# Patient Record
Sex: Female | Born: 1980 | Race: White | Hispanic: No | Marital: Single | Smoking: Never smoker
Health system: Southern US, Community
[De-identification: ages and names within clinical notes are randomized; demographics above are authoritative.]

## PROBLEM LIST (undated history)

## (undated) DIAGNOSIS — I10 Essential (primary) hypertension: Secondary | ICD-10-CM

## (undated) DIAGNOSIS — F319 Bipolar disorder, unspecified: Secondary | ICD-10-CM

## (undated) DIAGNOSIS — N83209 Unspecified ovarian cyst, unspecified side: Secondary | ICD-10-CM

## (undated) HISTORY — PX: OTHER SURGICAL HISTORY: SHX169

## (undated) HISTORY — PX: TUBAL LIGATION: SHX77

---

## 2008-03-08 ENCOUNTER — Emergency Department (HOSPITAL_COMMUNITY): Admission: EM | Admit: 2008-03-08 | Discharge: 2008-03-08 | Payer: Self-pay | Admitting: Emergency Medicine

## 2009-06-11 ENCOUNTER — Emergency Department (HOSPITAL_COMMUNITY): Admission: EM | Admit: 2009-06-11 | Discharge: 2009-06-12 | Payer: Self-pay | Admitting: Emergency Medicine

## 2009-10-24 ENCOUNTER — Emergency Department (HOSPITAL_COMMUNITY): Admission: EM | Admit: 2009-10-24 | Discharge: 2009-10-24 | Payer: Self-pay | Admitting: Emergency Medicine

## 2010-03-17 ENCOUNTER — Emergency Department (HOSPITAL_COMMUNITY)
Admission: EM | Admit: 2010-03-17 | Discharge: 2010-03-17 | Payer: Self-pay | Source: Home / Self Care | Admitting: Emergency Medicine

## 2010-05-30 LAB — CBC
HCT: 36.4 % (ref 36.0–46.0)
Hemoglobin: 12.5 g/dL (ref 12.0–15.0)
MCH: 28 pg (ref 26.0–34.0)
MCHC: 34.3 g/dL (ref 30.0–36.0)
MCV: 81.6 fL (ref 78.0–100.0)
Platelets: 308 K/uL (ref 150–400)
RBC: 4.46 MIL/uL (ref 3.87–5.11)
RDW: 13.2 % (ref 11.5–15.5)
WBC: 10.4 K/uL (ref 4.0–10.5)

## 2010-05-30 LAB — POCT PREGNANCY, URINE: Preg Test, Ur: NEGATIVE

## 2010-05-30 LAB — DIFFERENTIAL
Basophils Relative: 0 % (ref 0–1)
Eosinophils Absolute: 0.1 10*3/uL (ref 0.0–0.7)
Monocytes Absolute: 0.8 10*3/uL (ref 0.1–1.0)
Neutrophils Relative %: 75 % (ref 43–77)

## 2010-08-12 ENCOUNTER — Encounter (HOSPITAL_COMMUNITY): Payer: Medicaid Other

## 2010-08-12 ENCOUNTER — Other Ambulatory Visit: Payer: Self-pay | Admitting: Obstetrics & Gynecology

## 2010-08-12 LAB — CBC
HCT: 37.3 % (ref 36.0–46.0)
Platelets: 344 10*3/uL (ref 150–400)
RDW: 13.8 % (ref 11.5–15.5)
WBC: 8 10*3/uL (ref 4.0–10.5)

## 2010-08-12 LAB — COMPREHENSIVE METABOLIC PANEL
Albumin: 3.9 g/dL (ref 3.5–5.2)
Alkaline Phosphatase: 38 U/L — ABNORMAL LOW (ref 39–117)
BUN: 11 mg/dL (ref 6–23)
CO2: 25 mEq/L (ref 19–32)
Calcium: 10.5 mg/dL (ref 8.4–10.5)
Chloride: 107 mEq/L (ref 96–112)
GFR calc non Af Amer: >60 mL/min (ref >60)
Glucose, Bld: 80 mg/dL (ref 70–99)
Total Bilirubin: 0.3 mg/dL (ref 0.3–1.2)
Total Protein: 7.3 g/dL (ref 6.0–8.3)

## 2010-08-12 LAB — URINALYSIS, ROUTINE W REFLEX MICROSCOPIC
Bilirubin Urine: NEGATIVE
Glucose, UA: NEGATIVE mg/dL
Nitrite: NEGATIVE
Specific Gravity, Urine: >1.03 — ABNORMAL HIGH (ref 1.005–1.030)

## 2010-08-12 LAB — SURGICAL PCR SCREEN: Staphylococcus aureus: NEGATIVE

## 2010-08-12 LAB — URINE MICROSCOPIC-ADD ON

## 2010-08-13 LAB — HCG, QUANTITATIVE, PREGNANCY: hCG, Beta Chain, Quant, S: 1 m[IU]/mL (ref <5)

## 2010-08-17 ENCOUNTER — Ambulatory Visit (HOSPITAL_COMMUNITY)
Admission: RE | Admit: 2010-08-17 | Discharge: 2010-08-17 | Disposition: A | Discharge status: Home / Self Care | Payer: Medicaid Other | Source: Ambulatory Visit | Attending: Obstetrics & Gynecology | Admitting: Obstetrics & Gynecology

## 2010-08-17 ENCOUNTER — Other Ambulatory Visit: Payer: Self-pay | Admitting: Obstetrics & Gynecology

## 2010-08-17 DIAGNOSIS — N946 Dysmenorrhea, unspecified: Secondary | ICD-10-CM | POA: Insufficient documentation

## 2010-08-17 DIAGNOSIS — N920 Excessive and frequent menstruation with regular cycle: Secondary | ICD-10-CM | POA: Insufficient documentation

## 2010-08-31 NOTE — Op Note (Signed)
  NAMEJOCABED, CHEESE NO.:  [id number]  MEDICAL RECORD NO.:  [id number]           PATIENT TYPE:  O  LOCATION:  DAYP                          FACILITY:  [facility]  PHYSICIAN:  Lazaro Arms, M.D.   DATE OF BIRTH:  11/07/1980  DATE OF PROCEDURE:  08/17/2010 DATE OF DISCHARGE:                              OPERATIVE REPORT   PREOPERATIVE DIAGNOSES: 1. Menometrorrhagia. 2. Dysmenorrhea.  POSTOPERATIVE DIAGNOSES: 1. Menometrorrhagia. 2. Dysmenorrhea.  PROCEDURE:  Hysteroscopy, dilation and curettage, and endometrial ablation.  SURGEON:  Lazaro Arms, MD  ANESTHESIA:  General endotracheal.  FINDINGS:  The patient had normal endometrium.  No polyps, no fibroids, no abnormalities.  OPERATION:  The patient was taken to the operating room, placed supine position where she underwent general endotracheal anesthesia, placed in low lithotomy position, prepped and draped in the usual sterile fashion. Graves speculum was placed.  Cervix was grasped.  The cervix was dilated serially to allow passage of the hysteroscope.  A diagnostic hysteroscopy was performed, and there were no abnormalities of the endometrium identified.  A vigorous uterine curettage was then performed, good uterine cry was obtained in all areas, endometrial curettings were sent to Pathology for evaluation.  ThermaChoice III endometrial ablation balloon was then used 26 mL of D5W was required to maintain a pressure between 190 and 200 mmHg throughout the procedure. It was heated to 87 degrees Celsius total therapy time was 10 minutes and 31 seconds.  All the fluid was returned at the end of procedure, and all the equipment worked properly.  The patient was awakened from anesthesia, taken to recovery room in good stable condition.  All counts were correct.  She received Toradol and Ancef preoperatively prophylactically.    Lazaro Arms, M.D.    Loraine Maple  D:  08/17/2010  T:  08/17/2010  Job:   [id number]  Electronically Signed by Duane Lope M.D. on 08/31/2010 02:51:10 PM

## 2010-09-21 ENCOUNTER — Emergency Department (HOSPITAL_COMMUNITY)
Admission: EM | Admit: 2010-09-21 | Discharge: 2010-09-21 | Disposition: A | Discharge status: Home / Self Care | Payer: Medicaid Other | Attending: Emergency Medicine | Admitting: Emergency Medicine

## 2010-09-21 DIAGNOSIS — R070 Pain in throat: Secondary | ICD-10-CM | POA: Insufficient documentation

## 2010-09-21 DIAGNOSIS — F319 Bipolar disorder, unspecified: Secondary | ICD-10-CM | POA: Insufficient documentation

## 2010-09-21 DIAGNOSIS — R51 Headache: Secondary | ICD-10-CM | POA: Insufficient documentation

## 2010-09-21 DIAGNOSIS — IMO0001 Reserved for inherently not codable concepts without codable children: Secondary | ICD-10-CM | POA: Insufficient documentation

## 2010-09-21 DIAGNOSIS — R509 Fever, unspecified: Secondary | ICD-10-CM | POA: Insufficient documentation

## 2010-09-21 DIAGNOSIS — J020 Streptococcal pharyngitis: Secondary | ICD-10-CM | POA: Insufficient documentation

## 2010-09-21 DIAGNOSIS — Z79899 Other long term (current) drug therapy: Secondary | ICD-10-CM | POA: Insufficient documentation

## 2010-11-30 IMAGING — CR DG HAND COMPLETE 3+V*R*
3 series · 3 of 3 positions shown · non-contrast
Comparison: None.

CLINICAL DATA: Hand pain.  Blunt trauma.

RIGHT HAND - COMPLETE 3+ VIEW

[view not recorded (1 of 3)]
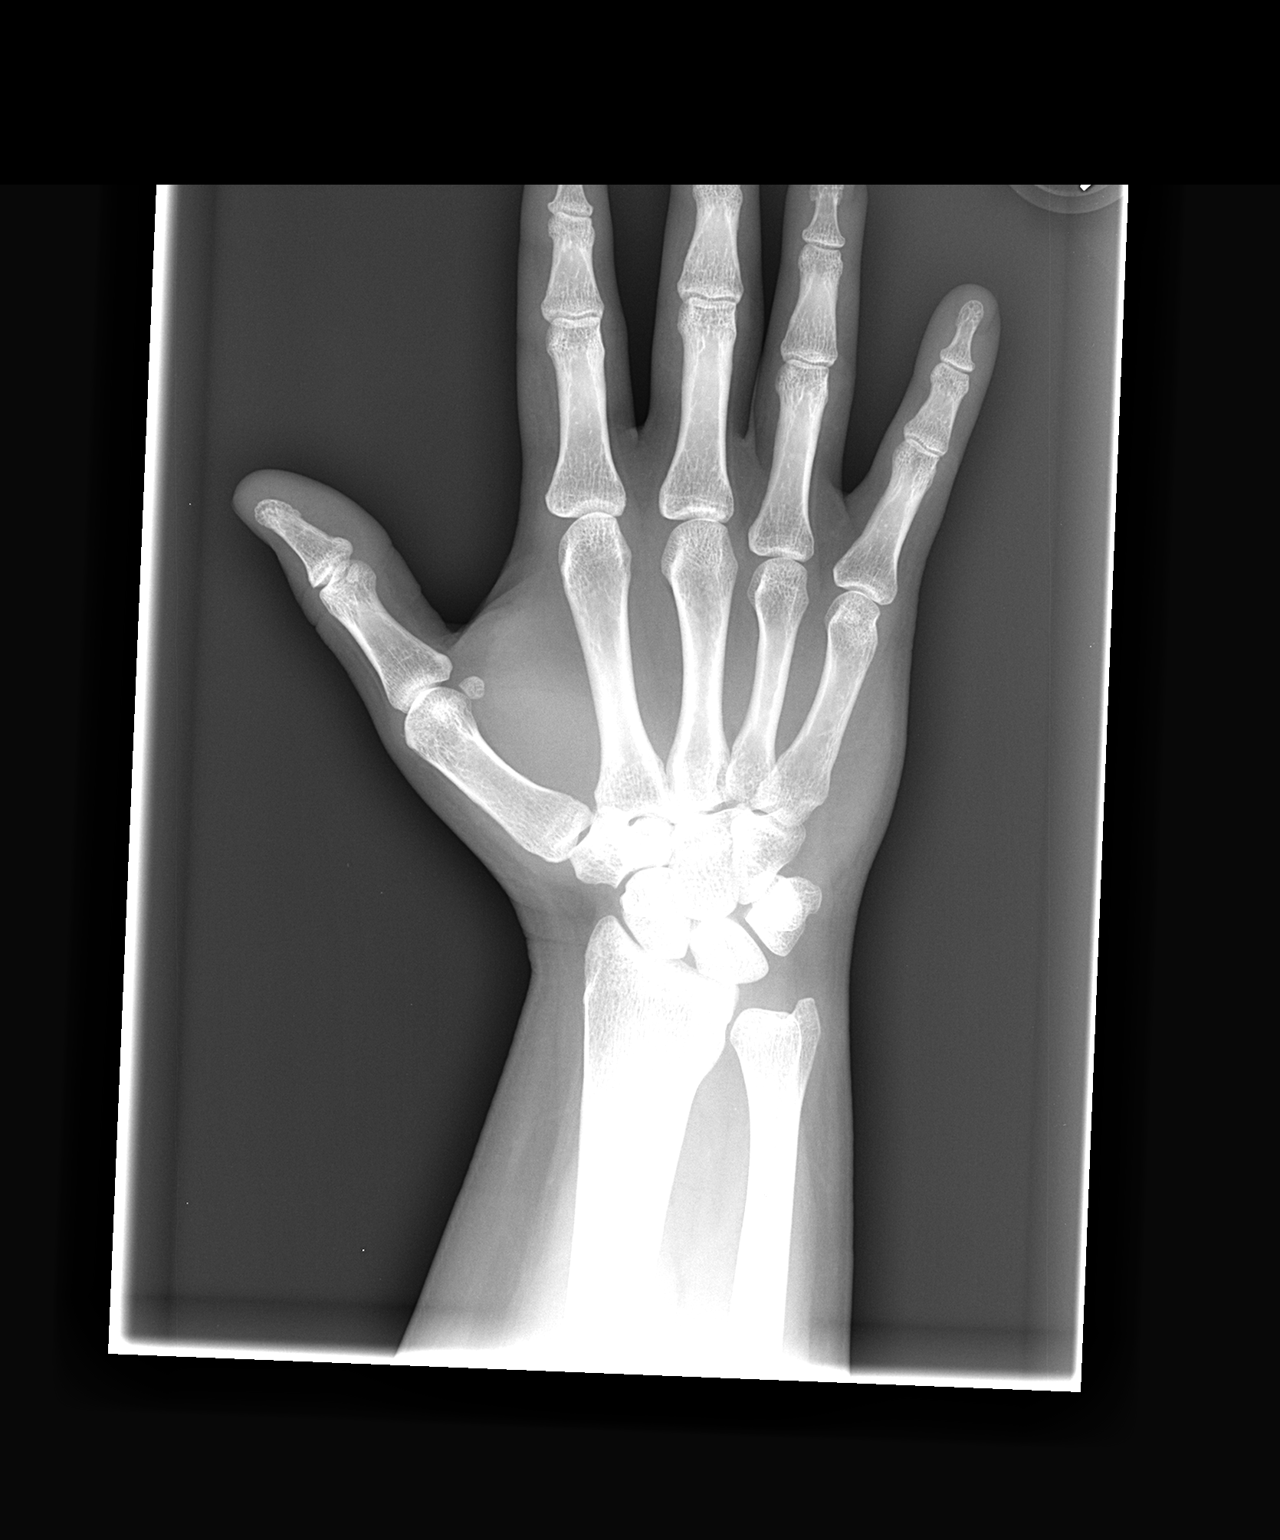

[view not recorded (2 of 3)]
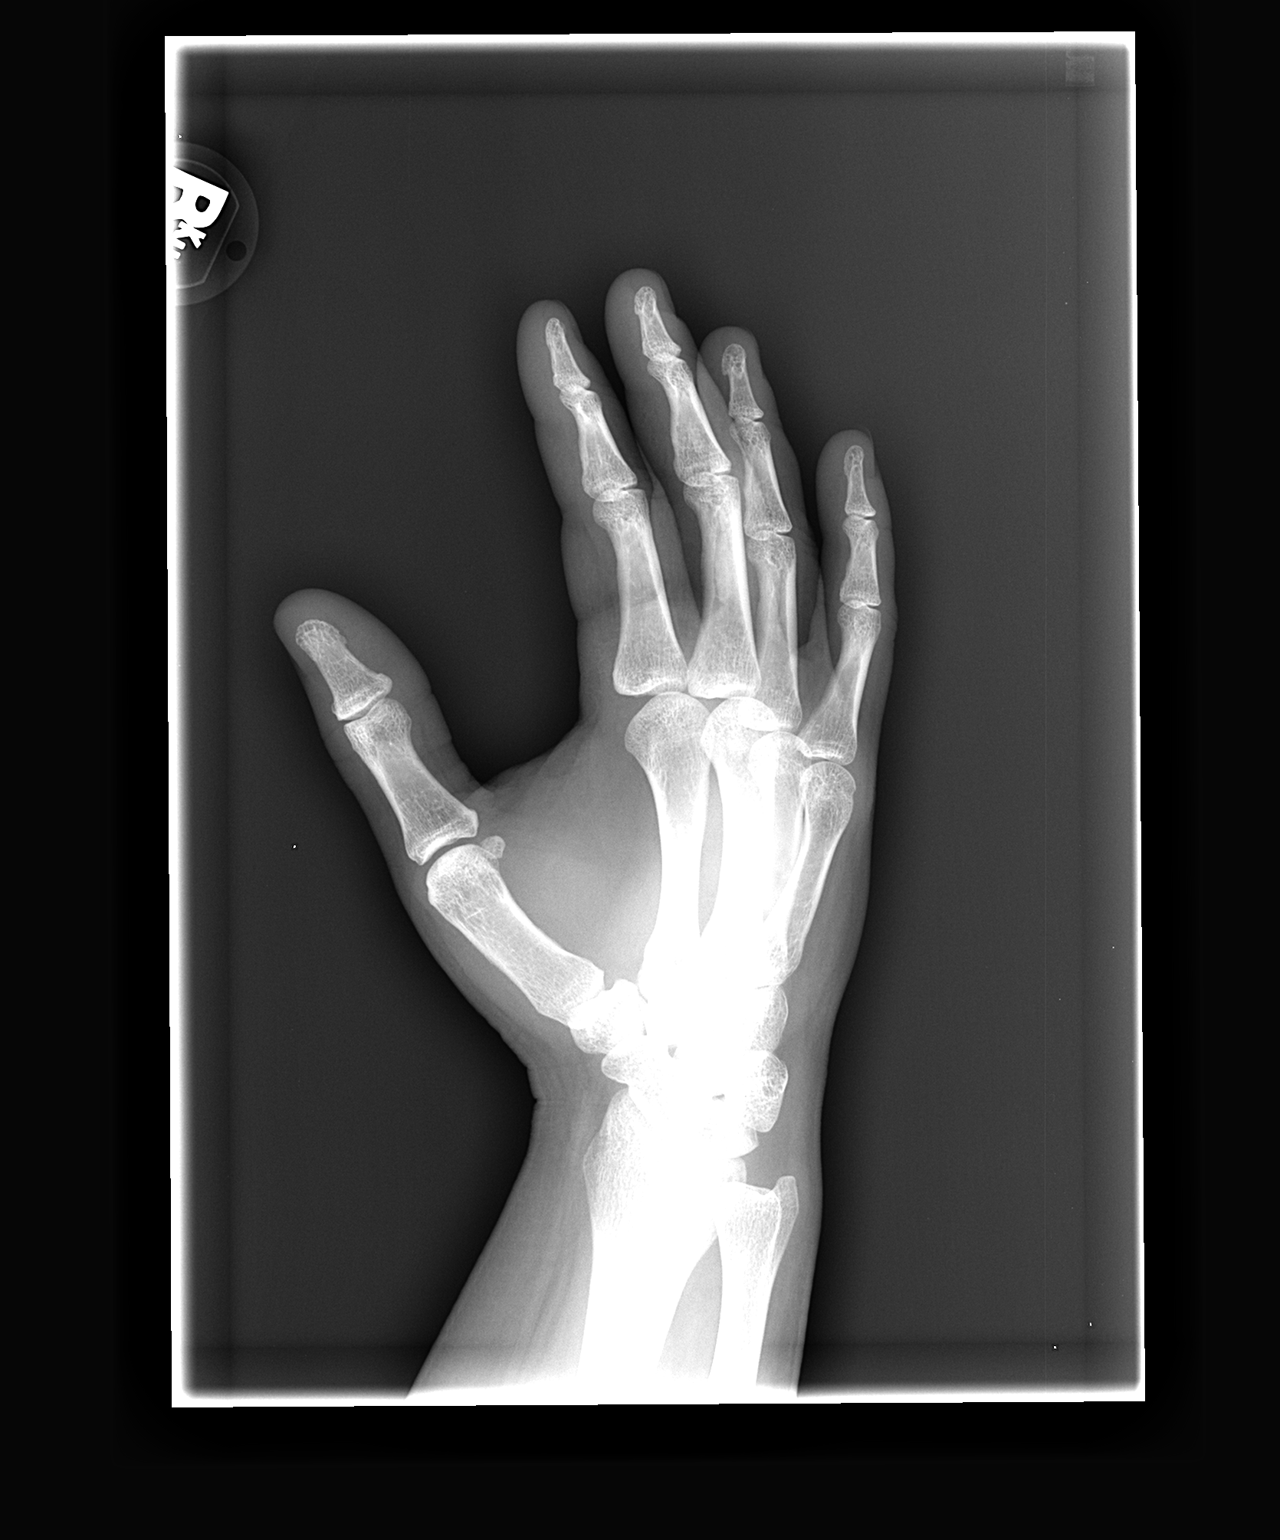

[view not recorded (3 of 3)]
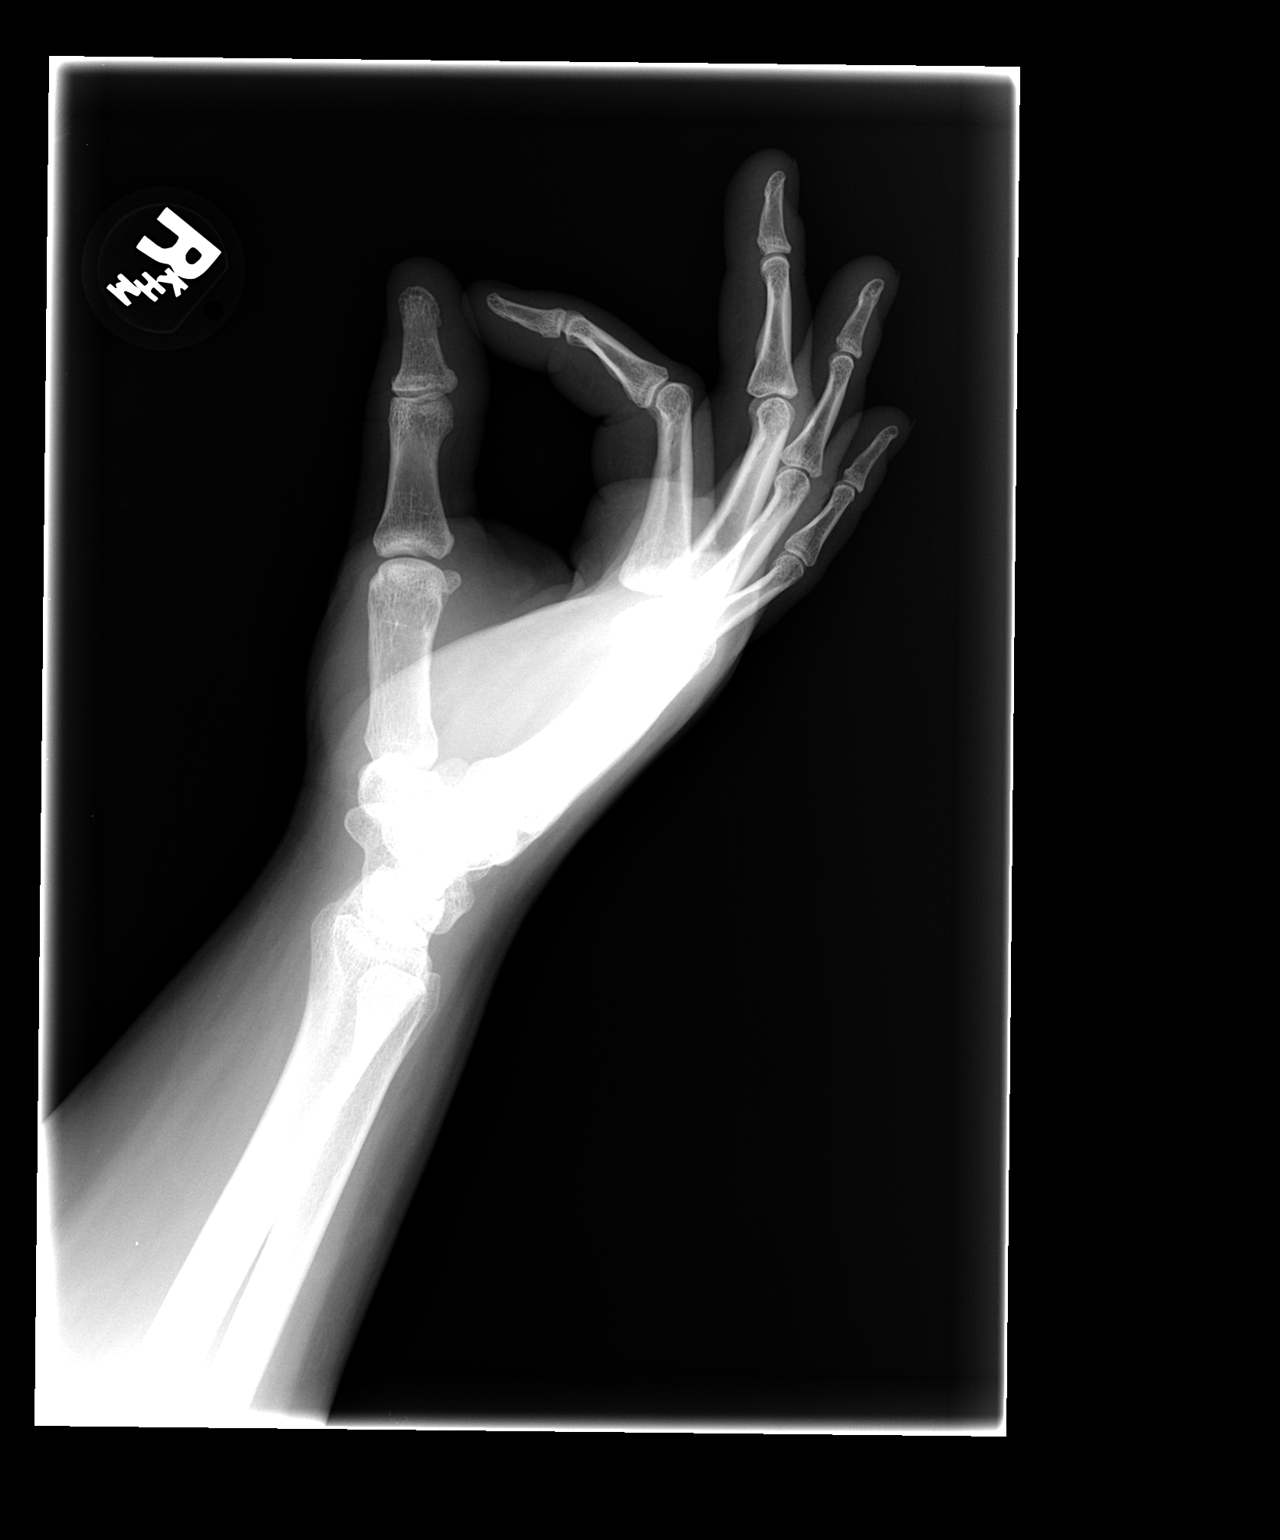

[3 of 3 positions shown; findings below may reference images not displayed]

FINDINGS: Alignment anatomic.  No fracture.  Soft tissues appear
normal.  The fifth metacarpal appears unremarkable.
IMPRESSION: Negative.

## 2010-12-23 LAB — URINALYSIS, ROUTINE W REFLEX MICROSCOPIC
Ketones, ur: NEGATIVE mg/dL
Nitrite: NEGATIVE
Urobilinogen, UA: 0.2 mg/dL (ref 0.0–1.0)

## 2010-12-23 LAB — PREGNANCY, URINE: Preg Test, Ur: NEGATIVE

## 2010-12-23 LAB — RAPID STREP SCREEN (MED CTR MEBANE ONLY): Streptococcus, Group A Screen (Direct): POSITIVE — AB

## 2011-05-12 ENCOUNTER — Encounter (HOSPITAL_COMMUNITY): Payer: Self-pay | Admitting: *Deleted

## 2011-05-12 ENCOUNTER — Emergency Department (HOSPITAL_COMMUNITY)
Admission: EM | Admit: 2011-05-12 | Discharge: 2011-05-12 | Disposition: A | Discharge status: Home / Self Care | Payer: Medicaid Other | Attending: Emergency Medicine | Admitting: Emergency Medicine

## 2011-05-12 ENCOUNTER — Emergency Department (HOSPITAL_COMMUNITY): Payer: Medicaid Other

## 2011-05-12 DIAGNOSIS — R112 Nausea with vomiting, unspecified: Secondary | ICD-10-CM | POA: Insufficient documentation

## 2011-05-12 DIAGNOSIS — R197 Diarrhea, unspecified: Secondary | ICD-10-CM | POA: Insufficient documentation

## 2011-05-12 DIAGNOSIS — R1031 Right lower quadrant pain: Secondary | ICD-10-CM | POA: Insufficient documentation

## 2011-05-12 DIAGNOSIS — R10813 Right lower quadrant abdominal tenderness: Secondary | ICD-10-CM | POA: Insufficient documentation

## 2011-05-12 LAB — CBC: Platelets: 346 10*3/uL (ref 150–400)

## 2011-05-12 LAB — PREGNANCY, URINE: Preg Test, Ur: NEGATIVE

## 2011-05-12 LAB — URINALYSIS, ROUTINE W REFLEX MICROSCOPIC
Ketones, ur: NEGATIVE mg/dL
Protein, ur: NEGATIVE mg/dL
Urobilinogen, UA: 0.2 mg/dL (ref 0.0–1.0)
pH: 5.5 (ref 5.0–8.0)

## 2011-05-12 LAB — BASIC METABOLIC PANEL
BUN: 8 mg/dL (ref 6–23)
Chloride: 107 mEq/L (ref 96–112)
GFR calc Af Amer: >90 mL/min (ref >90)
Sodium: 138 mEq/L (ref 135–145)

## 2011-05-12 MED ORDER — IOHEXOL 300 MG/ML  SOLN
100.0000 mL | Freq: Once | INTRAMUSCULAR | Status: AC | PRN
  Administered 2011-05-12: 100 mL via INTRAVENOUS

## 2011-05-12 MED ORDER — HYDROMORPHONE HCL PF 1 MG/ML IJ SOLN
1.0000 mg | Freq: Once | INTRAMUSCULAR | Status: AC
  Administered 2011-05-12: 1 mg via INTRAVENOUS
  Filled 2011-05-12: qty 1

## 2011-05-12 MED ORDER — SODIUM CHLORIDE 0.9 % IV SOLN: INTRAVENOUS | Status: DC

## 2011-05-12 MED ORDER — SODIUM CHLORIDE 0.9 % IV BOLUS (SEPSIS)
250.0000 mL | Freq: Once | INTRAVENOUS | Status: AC
  Administered 2011-05-12: 1000 mL via INTRAVENOUS

## 2011-05-12 MED ORDER — PROMETHAZINE HCL 25 MG PO TABS: 25.0000 mg | ORAL_TABLET | Freq: Four times a day (QID) | ORAL | Status: AC | PRN

## 2011-05-12 MED ORDER — HYDROCODONE-ACETAMINOPHEN 5-325 MG PO TABS: 1.0000 | ORAL_TABLET | Freq: Four times a day (QID) | ORAL | Status: AC | PRN

## 2011-05-12 MED ORDER — ONDANSETRON HCL 4 MG/2ML IJ SOLN
4.0000 mg | Freq: Once | INTRAMUSCULAR | Status: AC
  Administered 2011-05-12: 4 mg via INTRAVENOUS
  Filled 2011-05-12: qty 2

## 2011-05-12 NOTE — Discharge Instructions (Signed)
Workup for abdominal pain negative CT scan without evidence of appendicitis or any pelvic organ problems. Labs are also all normal. Take pain medicine as directed.  Followup with your cast will [place] [facility] if not improved in 2-3 days. Return here for new worse symptoms.

## 2011-05-12 NOTE — ED Provider Notes (Signed)
History   This chart was scribed for Shelda Jakes, MD by Clarita Crane. The patient was seen in room [facility]10/[facility]10. Patient's care was started at 1120.    CSN: [id number]  Arrival date & time 05/12/11  1120   First MD Initiated Contact with Patient 05/12/11 1133      Chief Complaint  Patient presents with  . Abdominal Pain    (Consider location/radiation/quality/duration/timing/severity/associated sxs/prior treatment) HPI Irisha FELICIDAD SUGARMAN is a 31 y.o. female who presents to the Emergency Department complaining of constant moderate to severe RLQ abdominal pain onset 5 days ago and persistent since with associated nausea and diarrhea. Patient notes abdominal pain became significantly worse yesterday and has began to intermittently radiate to back. States she also had an episode of vomiting 5 days ago but has had no episodes since then. Denies chest pain, SOB, swelling of extremities, HA, neck pain, lower back pain. Patient was evaluated at [facility] Department this morning and was referred to ED for evaluation. LMP- May 2012 (ablasion performed)  History reviewed. No pertinent past medical history.  Past Surgical History  Procedure Date  . Uterine abalation     No family history on file.  History  Substance Use Topics  . Smoking status: Not on file  . Smokeless tobacco: Not on file  . Alcohol Use:     OB History    Grav Para Term Preterm Abortions TAB SAB Ect Mult Living                  Review of Systems  Constitutional: Negative for fever and chills.  HENT: Negative for rhinorrhea and neck pain.   Eyes: Negative for pain.  Respiratory: Negative for cough and shortness of breath.   Cardiovascular: Negative for chest pain.  Gastrointestinal: Positive for nausea, vomiting, abdominal pain and diarrhea.  Genitourinary: Negative for dysuria.  Musculoskeletal: Negative for back pain.  Skin: Negative for rash.  Neurological: Negative for weakness and  headaches.    Allergies  Latex  Home Medications   Current Outpatient Rx  Name Route Sig Dispense Refill  . CLONAZEPAM 1 MG PO TABS Oral Take 0.5-1 mg by mouth daily as needed. anxiety    . LAMOTRIGINE 25 MG PO TABS Oral Take 25 mg by mouth daily.    . QUETIAPINE FUMARATE ER 150 MG PO TB24 Oral Take 150 mg by mouth at bedtime.    Marland Kitchen HYDROCODONE-ACETAMINOPHEN 5-325 MG PO TABS Oral Take 1-2 tablets by mouth every 6 (six) hours as needed for pain. 14 tablet 0  . PROMETHAZINE HCL 25 MG PO TABS Oral Take 1 tablet (25 mg total) by mouth every 6 (six) hours as needed for nausea. 12 tablet 0    BP 133/87  Pulse 75  Temp 98.6 F (37 C)  Resp 20  Ht 5\' 8"  (1.727 m)  Wt 274 lb (124.286 kg)  BMI 41.66 kg/m2  SpO2 100%  Physical Exam  Nursing note and vitals reviewed. Constitutional: She is oriented to person, place, and time. She appears well-developed and well-nourished. No distress.  HENT:  Head: Normocephalic and atraumatic.  Eyes: EOM are normal. Pupils are equal, round, and reactive to light.  Neck: Neck supple. No tracheal deviation present.  Cardiovascular: Normal rate and regular rhythm.  Exam reveals no gallop and no friction rub.   No murmur heard. Pulmonary/Chest: Effort normal. No respiratory distress. She has no wheezes. She has no rales.  Abdominal: Soft. Bowel sounds are normal. She exhibits no  distension. There is tenderness in the right lower quadrant. There is guarding.  Musculoskeletal: Normal range of motion. She exhibits no edema.  Neurological: She is alert and oriented to person, place, and time. No cranial nerve deficit or sensory deficit.       Distal neurovascular intact.   Skin: Skin is warm and dry.  Psychiatric: She has a normal mood and affect. Her behavior is normal.    ED Course  Procedures (including critical care time)  DIAGNOSTIC STUDIES: Oxygen Saturation is 100% on room air, normal by my interpretation.    COORDINATION OF CARE: 11:46AM-  Patient informed of current plan for treatment and evaluation and agrees with plan at this time.     Results for orders placed during the hospital encounter of 05/12/11  CBC      Component Value Range   WBC 8.1  4.0 - 10.5 (K/uL)   RBC 4.60  3.87 - 5.11 (MIL/uL)   Hemoglobin 12.9  12.0 - 15.0 (g/dL)   HCT 40.9  81.1 - 91.4 (%)   MCV 82.8  78.0 - 100.0 (fL)   MCH 28.0  26.0 - 34.0 (pg)   MCHC 33.9  30.0 - 36.0 (g/dL)   RDW 78.2  95.6 - 21.3 (%)   Platelets 346  150 - 400 (K/uL)  URINALYSIS, ROUTINE W REFLEX MICROSCOPIC      Component Value Range   Color, Urine YELLOW  YELLOW    APPearance CLEAR  CLEAR    Specific Gravity, Urine 1.030  1.005 - 1.030    pH 5.5  5.0 - 8.0    Glucose, UA NEGATIVE  NEGATIVE (mg/dL)   Hgb urine dipstick NEGATIVE  NEGATIVE    Bilirubin Urine NEGATIVE  NEGATIVE    Ketones, ur NEGATIVE  NEGATIVE (mg/dL)   Protein, ur NEGATIVE  NEGATIVE (mg/dL)   Urobilinogen, UA 0.2  0.0 - 1.0 (mg/dL)   Nitrite NEGATIVE  NEGATIVE    Leukocytes, UA NEGATIVE  NEGATIVE   PREGNANCY, URINE      Component Value Range   Preg Test, Ur NEGATIVE  NEGATIVE   BASIC METABOLIC PANEL      Component Value Range   Sodium 138  135 - 145 (mEq/L)   Potassium 4.0  3.5 - 5.1 (mEq/L)   Chloride 107  96 - 112 (mEq/L)   CO2 24  19 - 32 (mEq/L)   Glucose, Bld 87  70 - 99 (mg/dL)   BUN 8  6 - 23 (mg/dL)   Creatinine, Ser 0.86  0.50 - 1.10 (mg/dL)   Calcium 9.1  8.4 - 57.8 (mg/dL)   GFR calc non Af Amer >90  >90 (mL/min)   GFR calc Af Amer >90  >90 (mL/min)    Ct Abdomen Pelvis W Contrast  05/12/2011  *RADIOLOGY REPORT*  Clinical Data: Abdominal pain.  CT ABDOMEN AND PELVIS WITH CONTRAST  Technique:  Multidetector CT imaging of the abdomen and pelvis was performed following the standard protocol during bolus administration of intravenous contrast.  Contrast: OMNIPAQUE IOHEXOL 300 MG/ML IV SOLN  Comparison: No priors.  Findings:  Lung Bases: Unremarkable.  Abdomen/Pelvis:  The  enhanced appearance of the liver, gallbladder, pancreas, spleen, bilateral adrenal glands and bilateral kidneys is unremarkable.  There is no ascites or pneumoperitoneum and no pathologic distension of bowel.  No pathologic lymphadenopathy. Normal appendix.  Uterus and ovaries are unremarkable.  Urinary bladder is unremarkable in appearance.  Musculoskeletal: There are no aggressive appearing lytic or blastic lesions noted in  the visualized portions of the skeleton.  IMPRESSION: 1.  No acute findings in the abdomen or pelvis to account for the patient's symptoms. 2.  Normal appendix.  Original Report Authenticated By: Florencia Reasons, M.D.     1. Abdominal pain       MDM  Her cup of right lower corner abdominal pain without any specific findings CT scan shows no pelvic abnormalities no appendicitis. No leukocytosis no urinary tract infection. Patient is not pregnant. Etiology of right lower quadrant pain is not clear but no evidence of an acute abdominal process. We'll treat with pain medicine antinausea medicine and followup with her primary care Dr. If not improved in 2-3 days.      I personally performed the services described in this documentation, which was scribed in my presence. The recorded information has been reviewed and considered.     Shelda Jakes, MD 05/12/11 [phone]

## 2011-05-12 NOTE — ED Notes (Signed)
Pt drinking second bottle of contrast 

## 2011-05-12 NOTE — ED Notes (Signed)
Pt presents to er from [facility] to r/o appendicitis, pt c/o right lower quad pain that started 4 days ago with n/v/d, vomiting and diarrhea have stopped, still c/o nausea, pain started in lower abd area and moved to right lower quad. Pt tender in right lower quad with palpation. Dr. Deretha Emory at bedside upon pt arrival to ed

## 2011-05-12 NOTE — ED Notes (Signed)
Sent from [facility] for possible appendicitis

## 2011-05-17 ENCOUNTER — Encounter (HOSPITAL_COMMUNITY): Payer: Self-pay | Admitting: *Deleted

## 2011-05-17 ENCOUNTER — Emergency Department (HOSPITAL_COMMUNITY): Payer: Medicaid Other

## 2011-05-17 ENCOUNTER — Emergency Department (HOSPITAL_COMMUNITY)
Admission: EM | Admit: 2011-05-17 | Discharge: 2011-05-17 | Disposition: A | Discharge status: Home / Self Care | Payer: Medicaid Other | Attending: Emergency Medicine | Admitting: Emergency Medicine

## 2011-05-17 DIAGNOSIS — Z9851 Tubal ligation status: Secondary | ICD-10-CM | POA: Insufficient documentation

## 2011-05-17 DIAGNOSIS — R10813 Right lower quadrant abdominal tenderness: Secondary | ICD-10-CM | POA: Insufficient documentation

## 2011-05-17 DIAGNOSIS — N949 Unspecified condition associated with female genital organs and menstrual cycle: Secondary | ICD-10-CM | POA: Insufficient documentation

## 2011-05-17 DIAGNOSIS — R1031 Right lower quadrant pain: Secondary | ICD-10-CM | POA: Insufficient documentation

## 2011-05-17 DIAGNOSIS — R102 Pelvic and perineal pain: Secondary | ICD-10-CM

## 2011-05-17 LAB — URINALYSIS, ROUTINE W REFLEX MICROSCOPIC
Bilirubin Urine: NEGATIVE
Glucose, UA: NEGATIVE mg/dL
Nitrite: NEGATIVE
Urobilinogen, UA: 0.2 mg/dL (ref 0.0–1.0)
pH: 6 (ref 5.0–8.0)

## 2011-05-17 NOTE — ED Notes (Signed)
Pt c/o abdominal pain, nausea, and mucous in her stool. Pt states that she was seen on Friday in ED but has gotten worse.

## 2011-05-17 NOTE — ED Provider Notes (Signed)
History     CSN: [id number]  Arrival date & time 05/17/11  1137   First MD Initiated Contact with Patient 05/17/11 1308      Chief Complaint  Patient presents with  . Abdominal Pain    (Consider location/radiation/quality/duration/timing/severity/associated sxs/prior treatment) Patient is a 31 y.o. female presenting with abdominal pain. The history is provided by the patient.  Abdominal Pain The primary symptoms of the illness include abdominal pain.   patient presents complaining of right lower quadrant pain x10 days. Seen here for same and had a CAT scan that was done on February 22 which did not show any acute process. Pain has been constant and characterized as sharp. Nonradiating. No associated fever or vomiting some nausea. Denies any vaginal bleeding or discharge. No urinary symptoms. Does note some mucus in her stools however had a normal bowel movement yesterday.  History reviewed. No pertinent past medical history.  Past Surgical History  Procedure Date  . Uterine abalation   . Tubal ligation     History reviewed. No pertinent family history.  History  Substance Use Topics  . Smoking status: Never Smoker   . Smokeless tobacco: Not on file  . Alcohol Use: No    OB History    Grav Para Term Preterm Abortions TAB SAB Ect Mult Living                  Review of Systems  Gastrointestinal: Positive for abdominal pain.  All other systems reviewed and are negative.    Allergies  Latex  Home Medications   Current Outpatient Rx  Name Route Sig Dispense Refill  . CLONAZEPAM 1 MG PO TABS Oral Take 0.5-1 mg by mouth daily as needed. anxiety    . HYDROCODONE-ACETAMINOPHEN 5-325 MG PO TABS Oral Take 1-2 tablets by mouth every 6 (six) hours as needed for pain. 14 tablet 0  . LAMOTRIGINE 25 MG PO TABS Oral Take 25 mg by mouth daily.    Marland Kitchen PROMETHAZINE HCL 25 MG PO TABS Oral Take 1 tablet (25 mg total) by mouth every 6 (six) hours as needed for nausea. 12 tablet 0  .  QUETIAPINE FUMARATE ER 150 MG PO TB24 Oral Take 150 mg by mouth at bedtime.      BP 142/91  Pulse 105  Temp(Src) 98.2 F (36.8 C) (Oral)  Resp 20  Ht 5\' 8"  (1.727 m)  Wt 274 lb (124.286 kg)  BMI 41.66 kg/m2  SpO2 98%  Physical Exam  Nursing note and vitals reviewed. Constitutional: She is oriented to person, place, and time. She appears well-developed and well-nourished.  Non-toxic appearance. No distress.  HENT:  Head: Normocephalic and atraumatic.  Eyes: Conjunctivae, EOM and lids are normal. Pupils are equal, round, and reactive to light.  Neck: Normal range of motion. Neck supple. No tracheal deviation present. No mass present.  Cardiovascular: Normal rate, regular rhythm and normal heart sounds.  Exam reveals no gallop.   No murmur heard. Pulmonary/Chest: Effort normal and breath sounds normal. No stridor. No respiratory distress. She has no decreased breath sounds. She has no wheezes. She has no rhonchi. She has no rales.  Abdominal: Soft. Normal appearance and bowel sounds are normal. She exhibits no distension. There is tenderness in the right lower quadrant. There is no rigidity, no rebound, no guarding and no CVA tenderness.  Musculoskeletal: Normal range of motion. She exhibits no edema and no tenderness.  Neurological: She is alert and oriented to person, place, and time. She  has normal strength. No cranial nerve deficit or sensory deficit. GCS eye subscore is 4. GCS verbal subscore is 5. GCS motor subscore is 6.  Skin: Skin is warm and dry. No abrasion and no rash noted.  Psychiatric: She has a normal mood and affect. Her speech is normal and behavior is normal.    ED Course  Procedures (including critical care time)  Labs Reviewed  URINALYSIS, ROUTINE W REFLEX MICROSCOPIC - Abnormal; Notable for the following:    Specific Gravity, Urine >1.030 (*)    All other components within normal limits  PREGNANCY, URINE  URINE CULTURE   No results found.   No diagnosis  found.    MDM  Patient's labs and x-rays were discussed with her. She is going to follow her gynecologist        Toy Baker, MD 05/17/11 1527

## 2011-05-17 NOTE — Discharge Instructions (Signed)
Pelvic Pain Pelvic pain is pain below the belly button and located between your hips. Acute pain may last a few hours or days. Chronic pelvic pain may last weeks and months. The cause may be different for different types of pain. The pain may be dull or sharp, mild or severe and can interfere with your daily activities. Write down and tell your caregiver:   Exactly where the pain is located.   If it comes and goes or is there all the time.   When it happens (with sex, urination, bowel movement, etc.)   If the pain is related to your menstrual period or stress.  Your caregiver will take a full history and do a complete physical exam and Pap test. CAUSES   Painful menstrual periods (dysmenorrhea).   Normal ovulation (Mittelschmertz) that occurs in the middle of the menstrual cycle every month.   The pelvic organs get engorged with blood just before the menstrual period (pelvic congestive syndrome).   Scar tissue from an infection or past surgery (pelvic adhesions).   Cancer of the female pelvic organs. When there is pain with cancer, it has been there for a long time.   The lining of the uterus (endometrium) abnormally grows in places like the pelvis and on the pelvic organs (endometriosis).   A form of endometriosis with the lining of the uterus present inside of the muscle tissue of the uterus (adenomyosis).   Fibroid tumor (noncancerous) in the uterus.   Bladder problems such as infection, bladder spasms of the muscle tissue of the bladder.   Intestinal problems (irritable bowel syndrome, colitis, an ulcer or gastrointestinal infection).   Polyps of the cervix or uterus.   Pregnancy in the tube (ectopic pregnancy).   The opening of the cervix is too small for the menstrual blood to flow through it (cervical stenosis).   Physical or sexual abuse (past or present).   Musculo-skeletal problems from poor posture, problems with the vertebrae of the lower back or the uterine  pelvic muscles falling (prolapse).   Psychological problems such as depression or stress.   IUD (intrauterine device) in the uterus.  DIAGNOSIS  Tests to make a diagnosis depends on the type, location, severity and what causes the pain to occur. Tests that may be needed include:  Blood tests.   Urine tests   Ultrasound.   X-rays.   CT Scan.   MRI.   Laparoscopy.   Major surgery.  TREATMENT  Treatment will depend on the cause of the pain, which includes:  Prescription or over-the-counter pain medication.   Antibiotics.   Birth control pills.   Hormone treatment.   Nerve blocking injections.   Physical therapy.   Antidepressants.   Counseling with a psychiatrist or psychologist.   Minor or major surgery.  HOME CARE INSTRUCTIONS   Only take over-the-counter or prescription medicines for pain, discomfort or fever as directed by your caregiver.   Follow your caregiver's advice to treat your pain.   Rest.   Avoid sexual intercourse if it causes the pain.   Apply warm or cold compresses (which ever works best) to the pain area.   Do relaxation exercises such as yoga or meditation.   Try acupuncture.   Avoid stressful situations.   Try group therapy.   If the pain is because of a stomach/intestinal upset, drink clear liquids, eat a bland light food diet until the symptoms go away.  SEEK MEDICAL CARE IF:   You need stronger prescription pain medication.     You develop pain with sexual intercourse.   You have pain with urination.   You develop a temperature of 102 F (38.9 C) with the pain.   You are still in pain after 4 hours of taking prescription medication for the pain.   You need depression medication.   Your IUD is causing pain and you want it removed.  SEEK IMMEDIATE MEDICAL CARE IF:  You develop very severe pain or tenderness.   You faint, have chills, severe weakness or dehydration.   You develop heavy vaginal bleeding or passing  solid tissue.   You develop a temperature of 102 F (38.9 C) with the pain.   You have blood in the urine.   You are being physically or sexually abused.   You have uncontrolled vomiting and diarrhea.   You are depressed and afraid of harming yourself or someone else.  Document Released: 04/13/2004 Document Revised: 11/16/2010 Document Reviewed: 01/09/2008 [employer] Patient Information 2012 [employer]. 

## 2011-05-18 LAB — URINE CULTURE
Colony Count: >=100000
Culture  Setup Time: 201302280104

## 2011-06-09 ENCOUNTER — Emergency Department (HOSPITAL_COMMUNITY)
Admission: EM | Admit: 2011-06-09 | Discharge: 2011-06-09 | Disposition: A | Discharge status: Home / Self Care | Payer: Medicaid Other | Attending: Emergency Medicine | Admitting: Emergency Medicine

## 2011-06-09 ENCOUNTER — Encounter (HOSPITAL_COMMUNITY): Payer: Self-pay | Admitting: *Deleted

## 2011-06-09 DIAGNOSIS — K089 Disorder of teeth and supporting structures, unspecified: Secondary | ICD-10-CM | POA: Insufficient documentation

## 2011-06-09 DIAGNOSIS — K0889 Other specified disorders of teeth and supporting structures: Secondary | ICD-10-CM

## 2011-06-09 MED ORDER — OXYCODONE-ACETAMINOPHEN 5-325 MG PO TABS: 2.0000 | ORAL_TABLET | ORAL | Status: AC | PRN

## 2011-06-09 MED ORDER — OXYCODONE-ACETAMINOPHEN 5-325 MG PO TABS
1.0000 | ORAL_TABLET | Freq: Once | ORAL | Status: AC
  Administered 2011-06-09: 1 via ORAL
  Filled 2011-06-09: qty 1

## 2011-06-09 NOTE — ED Notes (Signed)
Pt reporting chipped tooth.  Reports she has been taking amoxicillin and Tylenol 3  since that time. Reporting continued pain.   Reports last Tylenol 3 was about 5 hours ago.  Pt also c./o nausea at present.

## 2011-06-09 NOTE — Discharge Instructions (Signed)
Use heat to sore areas 3-4 times a day. See the dentist of your choice as soon as possible.  RESOURCE GUIDE  Dental Problems  Patients with Medicaid: [facility] [phone] [address]                                           [phone] Phone:  [phone]                                                  Phone:  [phone]  If unable to pay or uninsured, contact:  [facility] or [facility]. to become qualified for the adult dental clinic.  Chronic Pain Problems Contact [facility]  [phone] Patients need to be referred by their primary care doctor.  Insufficient Money for Medicine Contact [employer] Way:  call "211" or [facility] 408-141-2007.  No Primary Care Doctor Call [facility]  204-670-1158 Other agencies that provide inexpensive medical care    [facility]  [phone]    [facility]  [phone]    [facility]  [phone]    [facility]  [phone]    [facility]  [phone]    [facility]  [phone]  Psychological Services [facility]  [phone] [facility]  9195023692 [facility] Mental Health   [phone] (emergency services [phone])  Substance Abuse Resources [facility]  7728247634 [facility] (731)785-0916 [facility] [place] [phone] Floydene Flock [phone] Residential & Outpatient Substance Abuse Program  [phone]  Abuse/Neglect [facility] Child Abuse Hotline 860-119-3119 [employer] Child Abuse Hotline 3523469703 (After Hours)  Emergency Shelter [facility] (325)[phone]  Maternity Homes [facility] at [facility] [place] [facility] [place] [phone] [facility] [phone]  MRSA Hotline #:   814-228-5349    [facility] Resources  [facility] [place]     United Way                           [facility] Dept. [address] [place]                       [address]      [address]  [place]                                                Cristobal Goldmann Phone:  [phone]                                   Phone:  [phone]  Phone:  [phone]  [facility] Mental Health Phone:  [phone]  [facility] Child Abuse Hotline (414)664-7941 615-488-5884 (After Hours) Dental Pain A tooth ache may be caused by cavities (tooth decay). Cavities expose the nerve of the tooth to air and hot or cold temperatures. It may come from an infection or abscess (also called a boil or furuncle) around your tooth. It is also often caused by dental caries (tooth decay). This causes the pain you are having. DIAGNOSIS  Your caregiver can diagnose this problem by exam. TREATMENT   If caused by an infection, it may be treated with medications which kill germs (antibiotics) and pain medications as prescribed by your caregiver. Take medications as directed.   Only take over-the-counter or prescription medicines for pain, discomfort, or fever as directed by your caregiver.   Whether the tooth ache today is caused by infection or dental disease, you should see your dentist as soon as possible for further care.  SEEK MEDICAL CARE IF: The exam and treatment you received today has been provided on an emergency basis only. This is not a substitute for complete medical or dental care. If your problem worsens or new problems (symptoms) appear, and you are unable to meet with your dentist, call or return to this location. SEEK IMMEDIATE MEDICAL CARE IF:   You have a fever.   You develop redness and swelling of your face, jaw, or neck.   You are unable to open your mouth.   You have severe pain uncontrolled by pain medicine.  MAKE SURE YOU:   Understand these instructions.   Will watch your condition.   Will get help right  away if you are not doing well or get worse.  Document Released: 03/06/2005 Document Revised: 02/23/2011 Document Reviewed: 10/23/2007 [facility] Patient Information 2012 [place], [place].

## 2011-06-09 NOTE — ED Provider Notes (Cosign Needed)
History     CSN: [id number]  Arrival date & time 06/09/11  4098   First MD Initiated Contact with Patient 06/09/11 2054      Chief Complaint  Patient presents with  . Dental Pain    (Consider location/radiation/quality/duration/timing/severity/associated sxs/prior treatment) HPI Comments: Julia Rosario is a 31 y.o. Female presents with c/o tooth pain leading to desire to be assessed in the ED. The sx(s) have been present for 4 days. Additional concerns are cannot see dentist for several weeks. Causative factors are dental problem. Palliative factors are home meds have not helped. The distress associated is moderate. The disorder has been present for 4 days.  Patient is a 31 y.o. female presenting with tooth pain.  Dental Pain  her PCP put her on amoxicillin 3 days ago  History reviewed. No pertinent past medical history.  Past Surgical History  Procedure Date  . Uterine abalation   . Tubal ligation     History reviewed. No pertinent family history.  History  Substance Use Topics  . Smoking status: Never Smoker   . Smokeless tobacco: Not on file  . Alcohol Use: No    OB History    Grav Para Term Preterm Abortions TAB SAB Ect Mult Living                  Review of Systems  All other systems reviewed and are negative.    Allergies  Latex  Home Medications   Current Outpatient Rx  Name Route Sig Dispense Refill  . ACETAMINOPHEN-CODEINE #3 300-30 MG PO TABS Oral Take 1 tablet by mouth daily as needed. For pain    . AMOXICILLIN 875 MG PO TABS Oral Take 875 mg by mouth 2 (two) times daily.    Marland Kitchen CLONAZEPAM 1 MG PO TABS Oral Take 0.5-1 mg by mouth daily as needed. anxiety    . IBUPROFEN 800 MG PO TABS Oral Take 800 mg by mouth every 8 (eight) hours as needed. For pain    . LAMOTRIGINE 25 MG PO TABS Oral Take 25 mg by mouth daily.    . QUETIAPINE FUMARATE ER 150 MG PO TB24 Oral Take 150 mg by mouth at bedtime.    . OXYCODONE-ACETAMINOPHEN 5-325 MG PO TABS Oral  Take 2 tablets by mouth every 4 (four) hours as needed for pain. 15 tablet 0    BP 138/113  Pulse 98  Temp(Src) 98.6 F (37 C) (Oral)  Resp 20  Ht 5\' 8"  (1.727 m)  Wt 275 lb (124.739 kg)  BMI 41.81 kg/m2  SpO2 100%  Physical Exam  Nursing note and vitals reviewed. Constitutional: She is oriented to person, place, and time. She appears well-developed and well-nourished. She appears distressed.  HENT:  Head: Normocephalic and atraumatic.       Mild dental disease, and gingivitis. Left inferior gutter have swelling and redness consistent with localized infection. There is no trismus. There are no apparent large dental fractures or caries.  Eyes: Conjunctivae and EOM are normal. Pupils are equal, round, and reactive to light.  Neck: Normal range of motion and phonation normal. Neck supple.  Cardiovascular: Normal rate.   Pulmonary/Chest: Effort normal. She exhibits no tenderness.  Abdominal: She exhibits no distension. There is no tenderness. There is no guarding.  Neurological: She is alert and oriented to person, place, and time. She has normal strength. She exhibits normal muscle tone.  Skin: Skin is warm and dry.  Psychiatric: She has a normal mood and affect.  Her behavior is normal. Judgment and thought content normal.    ED Course  Procedures (including critical care time)  Emergency department treatment: Percocet by mouth  Labs Reviewed - No data to display No results found.   1. Pain, dental       MDM  Dental pain without acute infection requiring additional treatment.    Plan: Home Medications- percocet, continue Amoxil ; Home Treatments- heat to sore area; Recommended follow up- f/u dentistry asap   Flint Melter, MD 06/11/11 1147

## 2011-06-09 NOTE — ED Notes (Signed)
Rt side jaw pain, has chipped tooth

## 2012-06-22 IMAGING — US US TRANSVAGINAL NON-OB
1 series · 14 of 25 positions shown · non-contrast
Comparison: None
Correlation:  CT pelvis 05/12/2011

CLINICAL DATA: Right side pelvic pain

TRANSABDOMINAL AND TRANSVAGINAL ULTRASOUND OF PELVIS
TECHNIQUE: Both transabdominal and transvaginal ultrasound
examinations of the pelvis were performed. Transabdominal technique
was performed for global imaging of the pelvis including uterus,
ovaries, adnexal regions, and pelvic cul-de-sac.

[Series 1: us transvaginal non-ob · 0.26mm/px · 14 of 94 slices shown]
[im 1/94]
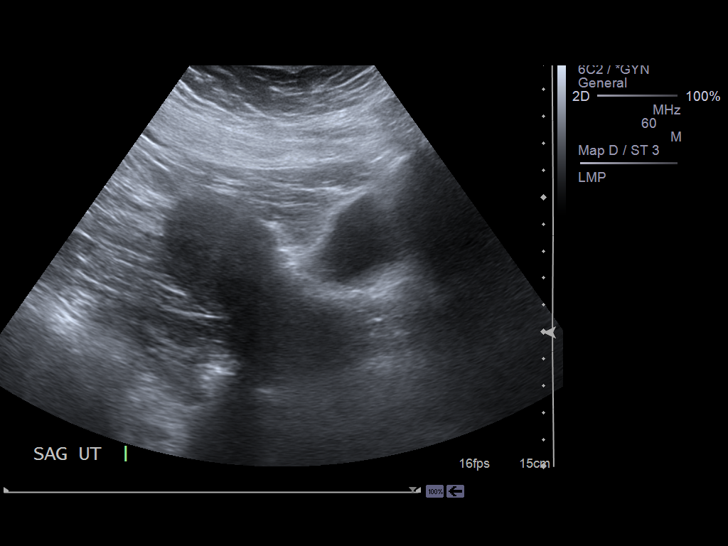
[im 8/94]
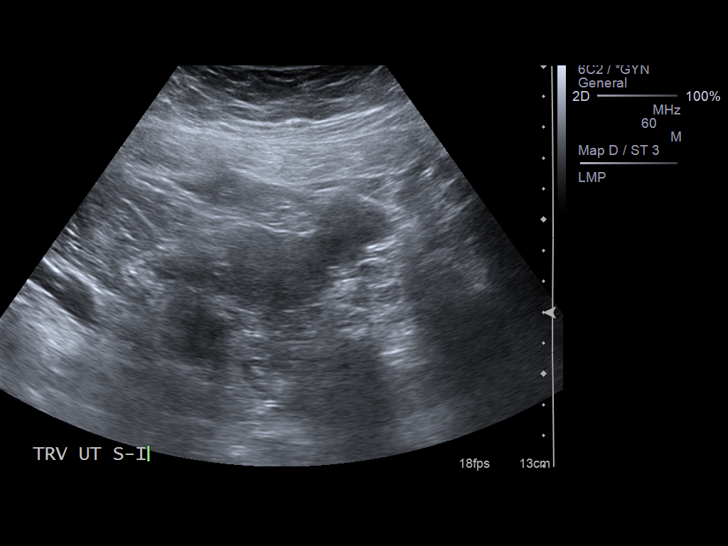
[im 16/94]
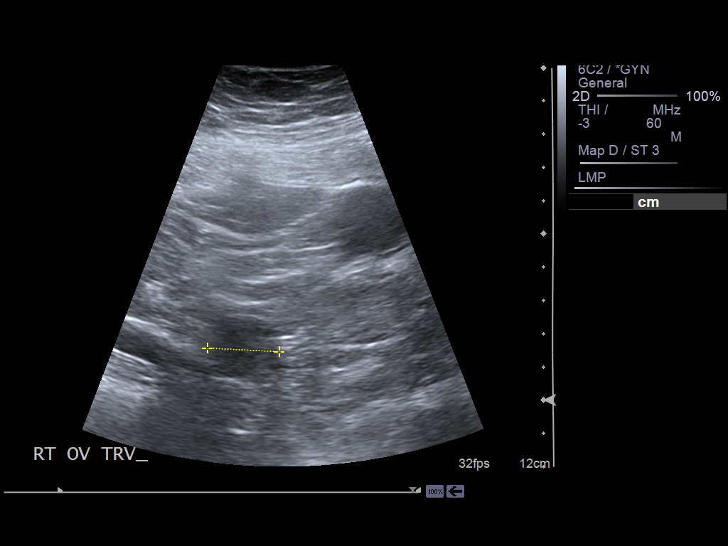
[im 24/94]
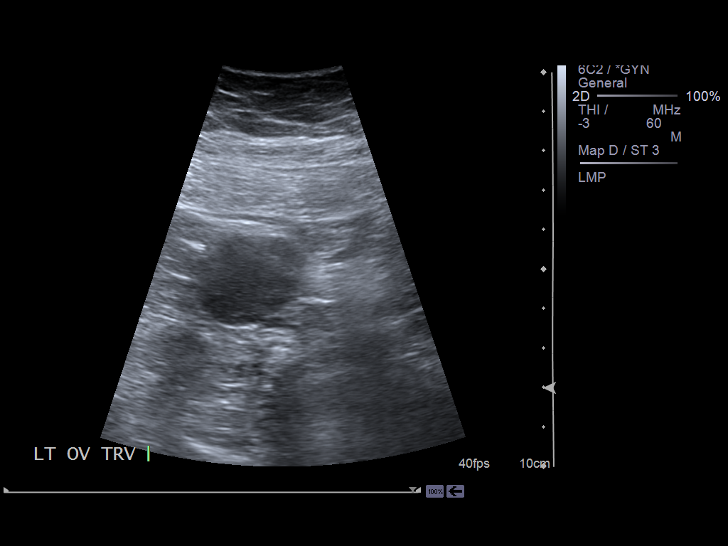
[im 32/94]
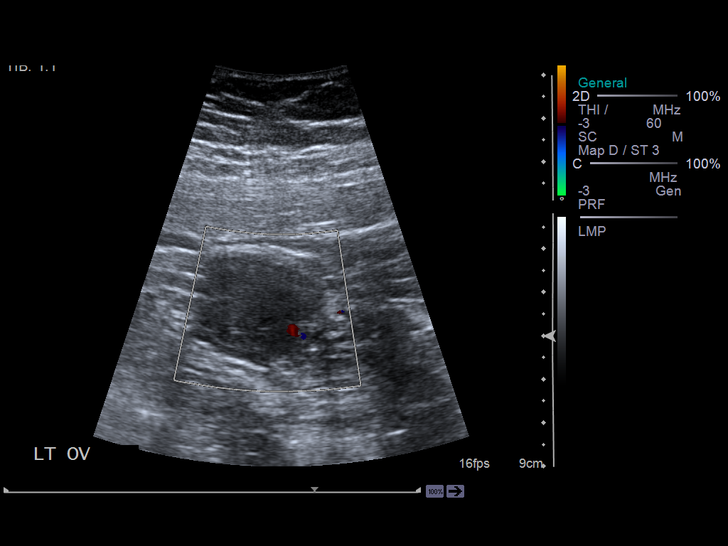
[im 35/94]
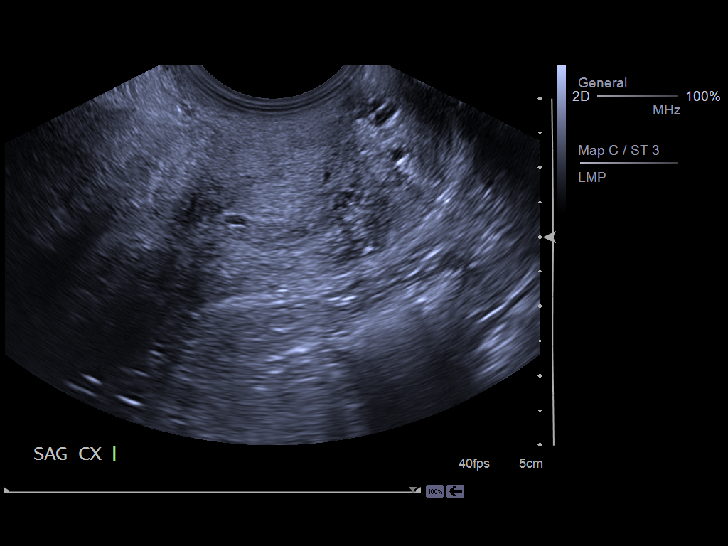
[im 43/94]
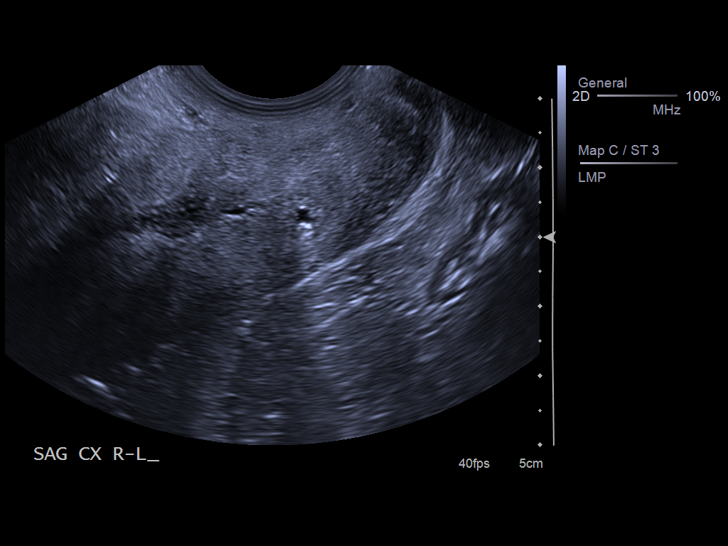
[im 51/94]
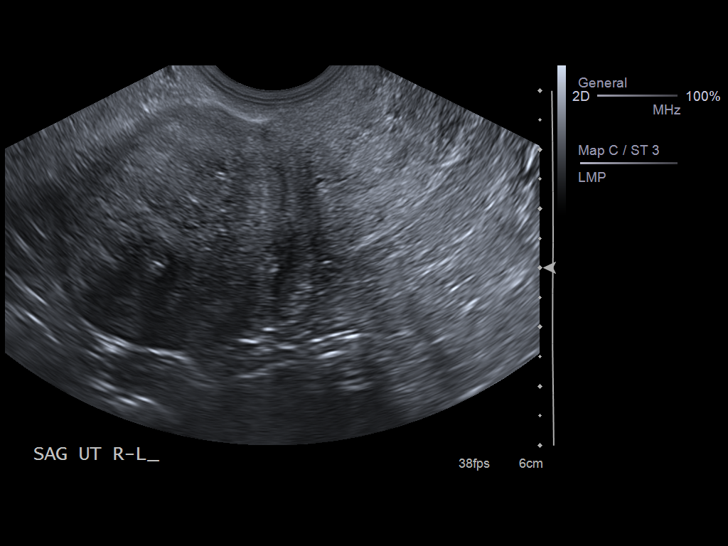
[im 59/94]
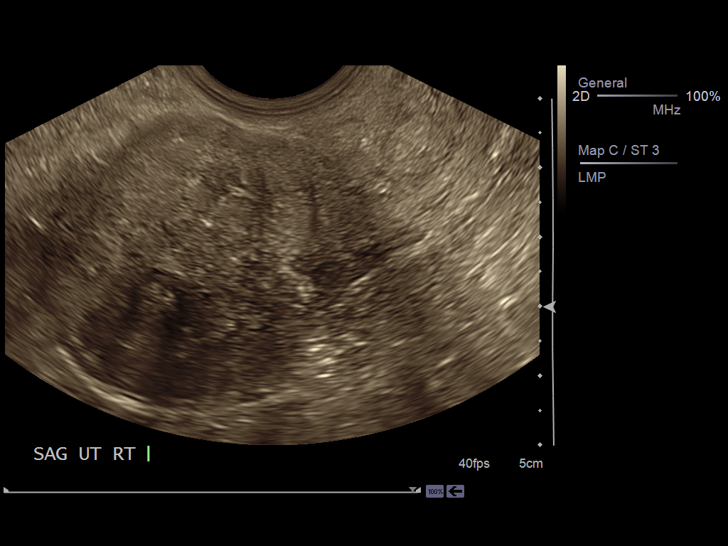
[im 63/94]
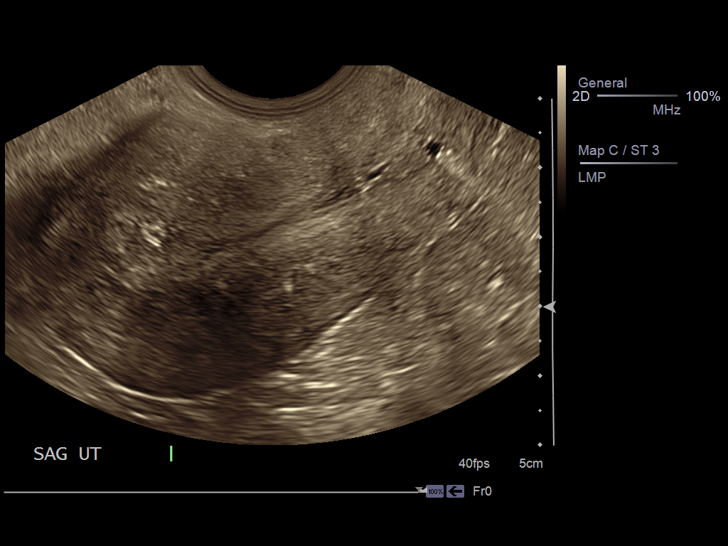
[im 70/94]
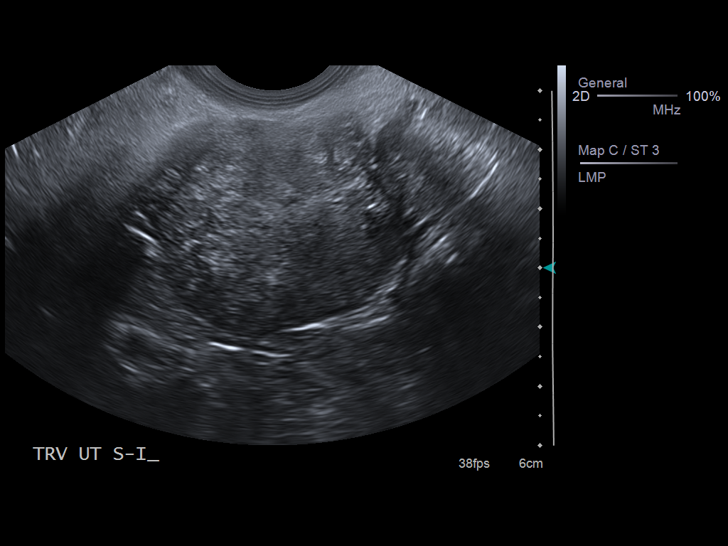
[im 78/94]
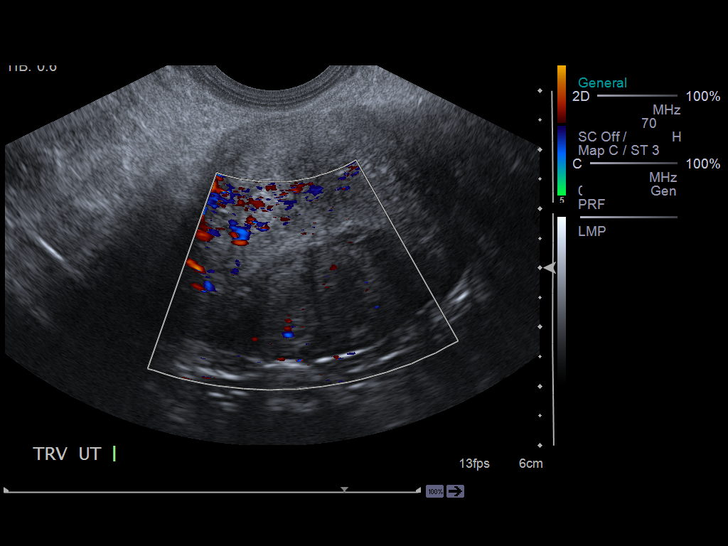
[im 86/94]
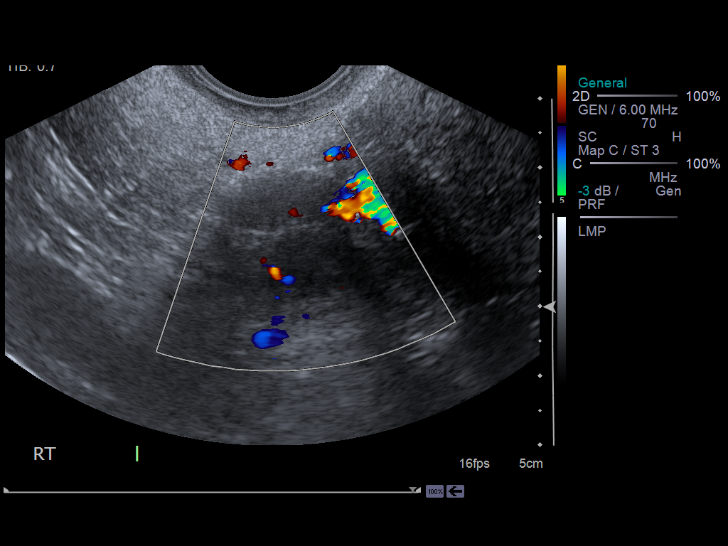
[im 94/94]
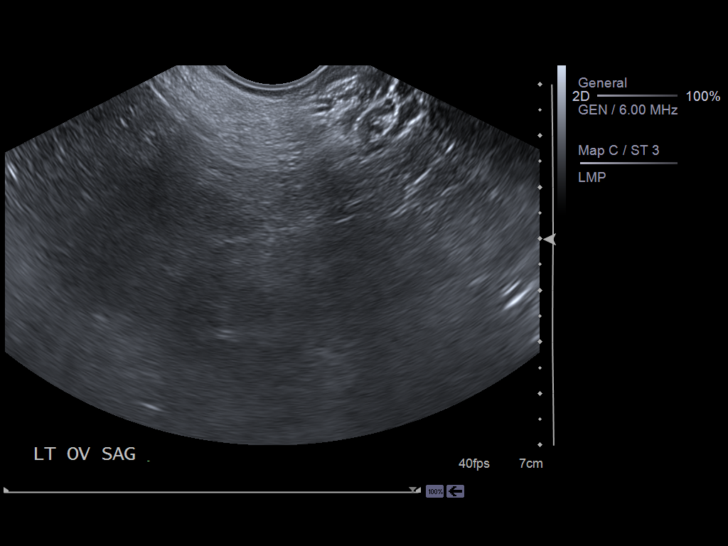

[14 of 25 positions shown; findings below may reference images not displayed]

It was necessary to proceed with endovaginal exam following the
transabdominal exam to visualize the endometrium and uterus.
FINDINGS: Uterus: 8.1 cm length by 4.1 cm AP by 5.1 cm transverse.
Heterogeneous appearance.  Question focal leiomyomas at posterior
aspect of the mid to upper uterus, up to 2.8 cm greatest size.

Endometrium: 7 mm thick, normal.  No endometrial fluid.

Right ovary:  2.8 x 1.9 x 2.3 cm.  Normal morphology without mass.

Left ovary: 3.1 x 2.3 x 2.4 cm.  Seen only on transabdominal
imaging.  No evidence of mass.

Other findings: No free fluid or adnexal masses.
IMPRESSION: Question uterine leiomyomas up to 2.8 cm greatest size.
Otherwise negative exam..

## 2012-08-20 ENCOUNTER — Emergency Department (HOSPITAL_COMMUNITY): Payer: Medicaid Other

## 2012-08-20 ENCOUNTER — Emergency Department (HOSPITAL_COMMUNITY)
Admission: EM | Admit: 2012-08-20 | Discharge: 2012-08-20 | Disposition: A | Discharge status: Home / Self Care | Payer: Medicaid Other | Attending: Emergency Medicine | Admitting: Emergency Medicine

## 2012-08-20 ENCOUNTER — Encounter (HOSPITAL_COMMUNITY): Payer: Self-pay

## 2012-08-20 DIAGNOSIS — Z91040 Latex allergy status: Secondary | ICD-10-CM | POA: Insufficient documentation

## 2012-08-20 DIAGNOSIS — Z9851 Tubal ligation status: Secondary | ICD-10-CM | POA: Insufficient documentation

## 2012-08-20 DIAGNOSIS — Z9889 Other specified postprocedural states: Secondary | ICD-10-CM | POA: Insufficient documentation

## 2012-08-20 DIAGNOSIS — Z3202 Encounter for pregnancy test, result negative: Secondary | ICD-10-CM | POA: Insufficient documentation

## 2012-08-20 DIAGNOSIS — N390 Urinary tract infection, site not specified: Secondary | ICD-10-CM | POA: Insufficient documentation

## 2012-08-20 DIAGNOSIS — Z79899 Other long term (current) drug therapy: Secondary | ICD-10-CM | POA: Insufficient documentation

## 2012-08-20 DIAGNOSIS — F319 Bipolar disorder, unspecified: Secondary | ICD-10-CM | POA: Insufficient documentation

## 2012-08-20 HISTORY — DX: Bipolar disorder, unspecified: F31.9

## 2012-08-20 LAB — URINE MICROSCOPIC-ADD ON

## 2012-08-20 LAB — CBC WITH DIFFERENTIAL/PLATELET
Basophils Absolute: 0 10*3/uL (ref 0.0–0.1)
Basophils Relative: 0 % (ref 0–1)
Eosinophils Relative: 1 % (ref 0–5)
HCT: 39.2 % (ref 36.0–46.0)
Lymphocytes Relative: 20 % (ref 12–46)
MCHC: 33.9 g/dL (ref 30.0–36.0)
Monocytes Absolute: 0.7 10*3/uL (ref 0.1–1.0)
Neutro Abs: 7.1 10*3/uL (ref 1.7–7.7)
Platelets: 328 10*3/uL (ref 150–400)
RDW: 13.3 % (ref 11.5–15.5)
WBC: 9.8 10*3/uL (ref 4.0–10.5)

## 2012-08-20 LAB — COMPREHENSIVE METABOLIC PANEL
ALT: 17 U/L (ref 0–35)
AST: 17 U/L (ref 0–37)
Albumin: 3.8 g/dL (ref 3.5–5.2)
Calcium: 9.4 mg/dL (ref 8.4–10.5)
Chloride: 105 mEq/L (ref 96–112)
Creatinine, Ser: 0.77 mg/dL (ref 0.50–1.10)
Sodium: 140 mEq/L (ref 135–145)

## 2012-08-20 LAB — URINALYSIS, ROUTINE W REFLEX MICROSCOPIC
Glucose, UA: NEGATIVE mg/dL
Leukocytes, UA: NEGATIVE
Specific Gravity, Urine: >1.03 — ABNORMAL HIGH (ref 1.005–1.030)
pH: 5.5 (ref 5.0–8.0)

## 2012-08-20 MED ORDER — HYDROCODONE-ACETAMINOPHEN 5-325 MG PO TABS: 1.0000 | ORAL_TABLET | Freq: Four times a day (QID) | ORAL | Status: DC | PRN

## 2012-08-20 MED ORDER — CEPHALEXIN 500 MG PO CAPS: 500.0000 mg | ORAL_CAPSULE | Freq: Four times a day (QID) | ORAL | Status: DC

## 2012-08-20 NOTE — ED Notes (Signed)
Pt reports has had r lower abd pain since Friday.  Reports history of ovarian cyst.  Pt says initially pain went away with aleve but not he aleve doesn't help.  Pt denies any vaginal bleeding or discharge but said noticed some blood in her urine once last night.

## 2012-08-20 NOTE — ED Provider Notes (Signed)
History     CSN: [id number]  Arrival date & time 08/20/12  1600   First MD Initiated Contact with Patient 08/20/12 1613      Chief Complaint  Patient presents with  . Abdominal Pain    (Consider location/radiation/quality/duration/timing/severity/associated sxs/prior treatment) Patient is a 32 y.o. female presenting with abdominal pain. The history is provided by the patient (the pt complains of lower abd pain).  Abdominal Pain This is a new problem. The current episode started 2 days ago. The problem occurs daily. The problem has not changed since onset.Associated symptoms include abdominal pain. Pertinent negatives include no chest pain and no headaches. Nothing aggravates the symptoms. Nothing relieves the symptoms.    Past Medical History  Diagnosis Date  . Bipolar 1 disorder     Past Surgical History  Procedure Laterality Date  . Uterine abalation    . Tubal ligation      No family history on file.  History  Substance Use Topics  . Smoking status: Never Smoker   . Smokeless tobacco: Not on file  . Alcohol Use: No    OB History   Grav Para Term Preterm Abortions TAB SAB Ect Mult Living                  Review of Systems  Constitutional: Negative for appetite change and fatigue.  HENT: Negative for congestion, sinus pressure and ear discharge.   Eyes: Negative for discharge.  Respiratory: Negative for cough.   Cardiovascular: Negative for chest pain.  Gastrointestinal: Positive for abdominal pain. Negative for diarrhea.  Genitourinary: Negative for frequency and hematuria.  Musculoskeletal: Negative for back pain.  Skin: Negative for rash.  Neurological: Negative for seizures and headaches.  Psychiatric/Behavioral: Negative for hallucinations.    Allergies  Latex  Home Medications   Current Outpatient Rx  Name  Route  Sig  Dispense  Refill  . clonazePAM (KLONOPIN) 1 MG tablet   Oral   Take 0.5-1 mg by mouth daily as needed. anxiety         .  lamoTRIgine (LAMICTAL) 25 MG tablet   Oral   Take 25 mg by mouth daily.         Marland Kitchen loratadine (CLARITIN) 10 MG tablet   Oral   Take 10 mg by mouth daily.         . traZODone (DESYREL) 50 MG tablet   Oral   Take 50 mg by mouth at bedtime as needed for sleep.         . cephALEXin (KEFLEX) 500 MG capsule   Oral   Take 1 capsule (500 mg total) by mouth 4 (four) times daily.   28 capsule   0   . HYDROcodone-acetaminophen (NORCO/VICODIN) 5-325 MG per tablet   Oral   Take 1 tablet by mouth every 6 (six) hours as needed for pain.   20 tablet   0   . ibuprofen (ADVIL,MOTRIN) 800 MG tablet   Oral   Take 800 mg by mouth every 8 (eight) hours as needed. For pain           BP 131/87  Pulse 78  Temp(Src) 97.3 F (36.3 C) (Oral)  Resp 16  Ht 5\' 8"  (1.727 m)  Wt 278 lb (126.1 kg)  BMI 42.28 kg/m2  SpO2 99%  Physical Exam  Constitutional: She is oriented to person, place, and time. She appears well-developed.  HENT:  Head: Normocephalic.  Eyes: Conjunctivae and EOM are normal. No scleral icterus.  Neck: Neck supple. No thyromegaly present.  Cardiovascular: Normal rate and regular rhythm.  Exam reveals no gallop and no friction rub.   No murmur heard. Pulmonary/Chest: No stridor. She has no wheezes. She has no rales. She exhibits no tenderness.  Abdominal: She exhibits no distension. There is tenderness. There is no rebound.  Mild rlq tender  Musculoskeletal: Normal range of motion. She exhibits no edema.  Lymphadenopathy:    She has no cervical adenopathy.  Neurological: She is oriented to person, place, and time. Coordination normal.  Skin: No rash noted. No erythema.  Psychiatric: She has a normal mood and affect. Her behavior is normal.    ED Course  Procedures (including critical care time)  Labs Reviewed  URINALYSIS, ROUTINE W REFLEX MICROSCOPIC - Abnormal; Notable for the following:    APPearance CLOUDY (*)    Specific Gravity, Urine >1.030 (*)    Hgb  urine dipstick LARGE (*)    Bilirubin Urine SMALL (*)    All other components within normal limits  URINE MICROSCOPIC-ADD ON - Abnormal; Notable for the following:    Squamous Epithelial / LPF MANY (*)    Bacteria, UA MANY (*)    Crystals CA OXALATE CRYSTALS (*)    All other components within normal limits  URINE CULTURE  CBC WITH DIFFERENTIAL  COMPREHENSIVE METABOLIC PANEL  PREGNANCY, URINE   US Transvaginal Non-ob  08/20/2012   *RADIOLOGY REPORT*  Clinical Data: Pelvic pain.  Prior uterine ablation and tubal ligation.  TRANSABDOMINAL AND TRANSVAGINAL ULTRASOUND OF PELVIS  Technique:  Both transabdominal and transvaginal ultrasound examinations of the pelvis were performed.  Transabdominal technique was performed for global imaging of the pelvis including uterus, ovaries, adnexal regions, and pelvic cul-de-sac.  It was necessary to proceed with endovaginal exam following the transabdominal exam to visualize the endometrial and ovaries.  Comparison:  05/12/2011 CT, pelvic ultrasound 05/17/2011  Findings: Uterus:  Anteverted, anteflexed.  Inhomogeneous myometrium with areas of posterior shadowing.  Posterior uterine fundal intramural, possibly partly submucosal, echogenic oval mass measures 1.3 x 1.3 by 0.8 cm.  Endometrium: 6 mm. Mildly inhomogeneous but predominately echogenic.  No focal abnormality.  Right ovary: 2.5 x 2.7 x 2.0 cm.  Normal.  Left ovary: Seen transabdominally only, 3.3 x 3.0 x 2.4 cm.  Other Findings:  No free fluid  IMPRESSION: Inhomogeneity of the myometrium which may reflect underlying unresolved fibroid, versus adenomyosis.  Echogenic fundal predominately intramural mass, which may represent an involuting/calcifying fibroid, lipoleiomyoma, or potentially volume averaging with an endometrial polyp not well visualized. Pelvic MRI with contrast could be helpful for further characterization.   Original Report Authenticated By: Christiana Pellant, M.D.   US Pelvis Complete  08/20/2012    *RADIOLOGY REPORT*  Clinical Data: Pelvic pain.  Prior uterine ablation and tubal ligation.  TRANSABDOMINAL AND TRANSVAGINAL ULTRASOUND OF PELVIS  Technique:  Both transabdominal and transvaginal ultrasound examinations of the pelvis were performed.  Transabdominal technique was performed for global imaging of the pelvis including uterus, ovaries, adnexal regions, and pelvic cul-de-sac.  It was necessary to proceed with endovaginal exam following the transabdominal exam to visualize the endometrial and ovaries.  Comparison:  05/12/2011 CT, pelvic ultrasound 05/17/2011  Findings: Uterus:  Anteverted, anteflexed.  Inhomogeneous myometrium with areas of posterior shadowing.  Posterior uterine fundal intramural, possibly partly submucosal, echogenic oval mass measures 1.3 x 1.3 by 0.8 cm.  Endometrium: 6 mm. Mildly inhomogeneous but predominately echogenic.  No focal abnormality.  Right ovary: 2.5 x 2.7 x 2.0 cm.  Normal.  Left ovary: Seen transabdominally only, 3.3 x 3.0 x 2.4 cm.  Other Findings:  No free fluid  IMPRESSION: Inhomogeneity of the myometrium which may reflect underlying unresolved fibroid, versus adenomyosis.  Echogenic fundal predominately intramural mass, which may represent an involuting/calcifying fibroid, lipoleiomyoma, or potentially volume averaging with an endometrial polyp not well visualized. Pelvic MRI with contrast could be helpful for further characterization.   Original Report Authenticated By: Christiana Pellant, M.D.     1. UTI (lower urinary tract infection)       MDM  abd pain,  Small fibroid and uti        Benny Lennert, MD 08/20/12 1844

## 2012-08-23 LAB — URINE CULTURE: Colony Count: >=100000

## 2012-08-24 ENCOUNTER — Telehealth (HOSPITAL_COMMUNITY): Payer: Self-pay | Admitting: Emergency Medicine

## 2012-08-24 NOTE — ED Notes (Signed)
Post ED Visit - Positive Culture Follow-up  Culture report reviewed by antimicrobial stewardship pharmacist: [x]  Wes Dulaney, Pharm.D., BCPS []  Celedonio Miyamoto, [address].D., BCPS []  Georgina Pillion, Pharm.D., BCPS []  [place], [address].D., BCPS, AAHIVP []  Estella Husk, Pharm.D., BCPS, AAHIVP  Positive urine culture Treated with Keflex, organism sensitive to the same and no further patient follow-up is required at this time.  Kylie A Holland 08/24/2012, 4:23 PM

## 2012-10-14 ENCOUNTER — Encounter: Payer: Self-pay | Admitting: Obstetrics and Gynecology

## 2012-10-16 ENCOUNTER — Ambulatory Visit (INDEPENDENT_AMBULATORY_CARE_PROVIDER_SITE_OTHER): Payer: Medicaid Other | Admitting: Obstetrics and Gynecology

## 2012-10-16 ENCOUNTER — Encounter: Payer: Self-pay | Admitting: Obstetrics and Gynecology

## 2012-10-16 VITALS — BP 126/80 | Ht 67.0 in | Wt 283.8 lb

## 2012-10-16 DIAGNOSIS — F3170 Bipolar disorder, currently in remission, most recent episode unspecified: Secondary | ICD-10-CM

## 2012-10-16 DIAGNOSIS — N83209 Unspecified ovarian cyst, unspecified side: Secondary | ICD-10-CM

## 2012-10-16 MED ORDER — NORGESTIMATE-ETH ESTRADIOL 0.25-35 MG-MCG PO TABS: 1.0000 | ORAL_TABLET | Freq: Every day | ORAL | Status: DC

## 2012-10-16 NOTE — Patient Instructions (Signed)
3 month trial of Sprintec.  If this does well, may continue x 1 yr. F/u 3mos , or at 1 yr.

## 2012-10-16 NOTE — Progress Notes (Signed)
Patient ID: Julia Rosario, female   DOB: 1980-12-05, 32 y.o.   MRN: [id number] Pt here today from [facility] to discuss ovarian cysts. Pt was told by family dr. Claiborne Billings she may need to be put on [facility] to help decrease the cysts. Had many u/s at [facility], at [facility] , last u/s 6/14 showed normal ovaries , but ?adenomyosis, . Pt not having any bleeding s/p ablation.(Dr Despina Hidden)  Fam Hx 2 sons with 1. Autism 2 Bthalassemia. No hx DVT or PE. No clotting disorder Surg Hx BTL endometrial ablation.,   IMP: reasonable candidate for trial OCP to see if pain improves P Rx: Sprintec.called to [employer].

## 2012-10-19 ENCOUNTER — Emergency Department (HOSPITAL_COMMUNITY)
Admission: EM | Admit: 2012-10-19 | Discharge: 2012-10-19 | Disposition: A | Discharge status: Home / Self Care | Payer: Medicaid Other | Attending: Emergency Medicine | Admitting: Emergency Medicine

## 2012-10-19 ENCOUNTER — Encounter (HOSPITAL_COMMUNITY): Payer: Self-pay | Admitting: Emergency Medicine

## 2012-10-19 DIAGNOSIS — I10 Essential (primary) hypertension: Secondary | ICD-10-CM | POA: Insufficient documentation

## 2012-10-19 DIAGNOSIS — F319 Bipolar disorder, unspecified: Secondary | ICD-10-CM | POA: Insufficient documentation

## 2012-10-19 DIAGNOSIS — Z91040 Latex allergy status: Secondary | ICD-10-CM | POA: Insufficient documentation

## 2012-10-19 DIAGNOSIS — S76112A Strain of left quadriceps muscle, fascia and tendon, initial encounter: Secondary | ICD-10-CM

## 2012-10-19 DIAGNOSIS — Y939 Activity, unspecified: Secondary | ICD-10-CM | POA: Insufficient documentation

## 2012-10-19 DIAGNOSIS — Y929 Unspecified place or not applicable: Secondary | ICD-10-CM | POA: Insufficient documentation

## 2012-10-19 DIAGNOSIS — X58XXXA Exposure to other specified factors, initial encounter: Secondary | ICD-10-CM | POA: Insufficient documentation

## 2012-10-19 DIAGNOSIS — S838X9A Sprain of other specified parts of unspecified knee, initial encounter: Secondary | ICD-10-CM | POA: Insufficient documentation

## 2012-10-19 DIAGNOSIS — Z79899 Other long term (current) drug therapy: Secondary | ICD-10-CM | POA: Insufficient documentation

## 2012-10-19 HISTORY — DX: Essential (primary) hypertension: I10

## 2012-10-19 MED ORDER — IBUPROFEN 600 MG PO TABS: 600.0000 mg | ORAL_TABLET | Freq: Four times a day (QID) | ORAL | Status: DC | PRN

## 2012-10-19 NOTE — ED Notes (Signed)
Pt c/o pain in right leg x 4 days. Pt states she recently started new bp medication and birth control. Pt states her pcp told her to come to ED to r/o blood clot.

## 2012-10-19 NOTE — ED Provider Notes (Signed)
CSN: [id number]     Arrival date & time 10/19/12  1710 History     First MD Initiated Contact with Patient 10/19/12 1718     Chief Complaint  Patient presents with  . Leg Pain   (Consider location/radiation/quality/duration/timing/severity/associated sxs/prior Treatment) HPI Pt with 5 days of R leg pain. No known trauma. No swelling or numbness. No bruising or rash. Pt states pain feels like tightness to R lateral upper leg. Worse with movement and palpation. No recent travel or surgery. No personal or family history of DVT. Pt started on oral birth control pills 2 days ago, after pain to leg present. No SOB or chest pain.  Past Medical History  Diagnosis Date  . Bipolar 1 disorder   . Hypertension    Past Surgical History  Procedure Laterality Date  . Uterine abalation    . Tubal ligation     Family History  Problem Relation Age of Onset  . Hypertension Sister   . Diabetes Maternal Grandmother   . Thyroid disease Maternal Grandmother   . Thyroid disease Paternal Grandmother    History  Substance Use Topics  . Smoking status: Never Smoker   . Smokeless tobacco: Never Used  . Alcohol Use: No   OB History   Grav Para Term Preterm Abortions TAB SAB Ect Mult Living                 Review of Systems  Constitutional: Negative for fever and chills.  HENT: Negative for neck pain.   Respiratory: Negative for cough and shortness of breath.   Cardiovascular: Negative for chest pain and leg swelling.  Gastrointestinal: Negative for nausea, vomiting and abdominal pain.  Genitourinary: Negative for flank pain.  Musculoskeletal: Positive for myalgias. Negative for back pain, joint swelling and arthralgias.  Skin: Negative for color change, pallor, rash and wound.  Neurological: Negative for dizziness, weakness, light-headedness, numbness and headaches.  All other systems reviewed and are negative.    Allergies  Latex  Home Medications   Current Outpatient Rx  Name  Route   Sig  Dispense  Refill  . amLODipine (NORVASC) 5 MG tablet   Oral   Take 5 mg by mouth daily.         . clonazePAM (KLONOPIN) 2 MG tablet   Oral   Take 2 mg by mouth 3 (three) times daily as needed for anxiety.         . lamoTRIgine (LAMICTAL) 150 MG tablet   Oral   Take 150 mg by mouth daily.         . norgestimate-ethinyl estradiol (SPRINTEC 28) 0.25-35 MG-MCG tablet   Oral   Take 1 tablet by mouth daily.         . QUEtiapine (SEROQUEL) 50 MG tablet   Oral   Take 50 mg by mouth at bedtime.         . traZODone (DESYREL) 50 MG tablet   Oral   Take 50 mg by mouth at bedtime as needed for sleep.         Marland Kitchen ibuprofen (ADVIL,MOTRIN) 600 MG tablet   Oral   Take 1 tablet (600 mg total) by mouth every 6 (six) hours as needed for pain.   30 tablet   0   . loratadine (CLARITIN) 10 MG tablet   Oral   Take 10 mg by mouth daily.          BP 144/99  Pulse 82  Temp(Src) 98 F (36.7 C) (  Oral)  Resp 20  Ht 5\' 2"  (1.575 m)  Wt 282 lb (127.914 kg)  BMI 51.57 kg/m2  SpO2 100% Physical Exam  Nursing note and vitals reviewed. Constitutional: She is oriented to person, place, and time. She appears well-developed and well-nourished. No distress.  HENT:  Head: Normocephalic and atraumatic.  Mouth/Throat: Oropharynx is clear and moist.  Eyes: EOM are normal. Pupils are equal, round, and reactive to light.  Neck: Normal range of motion. Neck supple.  Cardiovascular: Normal rate and regular rhythm.   Pulmonary/Chest: Effort normal and breath sounds normal. No respiratory distress. She has no wheezes. She has no rales. She exhibits no tenderness.  Abdominal: Soft. Bowel sounds are normal. She exhibits no distension and no mass. There is no tenderness. There is no rebound and no guarding.  Musculoskeletal: Normal range of motion. She exhibits tenderness (TTP over R lateral quadricep. No masses. FROM at all joints. No calf swelling or pain. 2+ distal pulses. ). She exhibits no  edema.  Neurological: She is alert and oriented to person, place, and time.  5/5 motor in all ext, sensation intact. Normal gait  Skin: Skin is warm and dry. No rash noted. No erythema.  Psychiatric: She has a normal mood and affect. Her behavior is normal.    ED Course   Procedures (including critical care time)  Labs Reviewed - No data to display No results found. 1. Quadriceps muscle strain, left, initial encounter     MDM  Do not think pt symptoms represent DVT. Pt is low risk and pain is lateral and reproduced with palpation of vastus lateralis. Pt encouraged to use NSAID's and ice/heat. Advised to return for worsening pain, swelling, SOB or chest pain. She agrees with plan.   Loren Racer, MD 10/19/12 1754

## 2012-10-20 ENCOUNTER — Emergency Department (HOSPITAL_COMMUNITY)
Admission: EM | Admit: 2012-10-20 | Discharge: 2012-10-20 | Disposition: A | Discharge status: Home / Self Care | Payer: Medicaid Other | Attending: Emergency Medicine | Admitting: Emergency Medicine

## 2012-10-20 ENCOUNTER — Encounter (HOSPITAL_COMMUNITY): Payer: Self-pay | Admitting: *Deleted

## 2012-10-20 DIAGNOSIS — F319 Bipolar disorder, unspecified: Secondary | ICD-10-CM | POA: Insufficient documentation

## 2012-10-20 DIAGNOSIS — Z79899 Other long term (current) drug therapy: Secondary | ICD-10-CM | POA: Insufficient documentation

## 2012-10-20 DIAGNOSIS — Z3202 Encounter for pregnancy test, result negative: Secondary | ICD-10-CM | POA: Insufficient documentation

## 2012-10-20 DIAGNOSIS — M25559 Pain in unspecified hip: Secondary | ICD-10-CM | POA: Insufficient documentation

## 2012-10-20 DIAGNOSIS — Z91040 Latex allergy status: Secondary | ICD-10-CM | POA: Insufficient documentation

## 2012-10-20 DIAGNOSIS — R109 Unspecified abdominal pain: Secondary | ICD-10-CM | POA: Insufficient documentation

## 2012-10-20 DIAGNOSIS — R112 Nausea with vomiting, unspecified: Secondary | ICD-10-CM | POA: Insufficient documentation

## 2012-10-20 DIAGNOSIS — R102 Pelvic and perineal pain: Secondary | ICD-10-CM

## 2012-10-20 DIAGNOSIS — Z9851 Tubal ligation status: Secondary | ICD-10-CM | POA: Insufficient documentation

## 2012-10-20 DIAGNOSIS — I10 Essential (primary) hypertension: Secondary | ICD-10-CM | POA: Insufficient documentation

## 2012-10-20 HISTORY — DX: Unspecified ovarian cyst, unspecified side: N83.209

## 2012-10-20 LAB — URINALYSIS, ROUTINE W REFLEX MICROSCOPIC
Ketones, ur: NEGATIVE mg/dL
Leukocytes, UA: NEGATIVE
Nitrite: NEGATIVE
pH: 7 (ref 5.0–8.0)

## 2012-10-20 LAB — PREGNANCY, URINE: Preg Test, Ur: NEGATIVE

## 2012-10-20 MED ORDER — OXYCODONE-ACETAMINOPHEN 5-325 MG PO TABS: 1.0000 | ORAL_TABLET | ORAL | Status: DC | PRN

## 2012-10-20 MED ORDER — ONDANSETRON HCL 4 MG/2ML IJ SOLN
4.0000 mg | Freq: Once | INTRAMUSCULAR | Status: AC
  Administered 2012-10-20: 4 mg via INTRAVENOUS
  Filled 2012-10-20: qty 2

## 2012-10-20 MED ORDER — HYDROMORPHONE HCL PF 1 MG/ML IJ SOLN
1.0000 mg | Freq: Once | INTRAMUSCULAR | Status: AC
  Administered 2012-10-20: 1 mg via INTRAVENOUS
  Filled 2012-10-20: qty 1

## 2012-10-20 NOTE — ED Notes (Signed)
Pt c/o right lower back pain that radiates around to right abd area and down right leg, states that the pain started this am, was seen in er yesterday for pain to right leg. Denies any injury. Did take lortab 7.5/325mg  around 6 am with no improvement in pain . Admits to n/v this am due to pain

## 2012-10-20 NOTE — ED Provider Notes (Signed)
CSN: [id number]     Arrival date & time 10/20/12  7829 History    This chart was scribed for Joya Gaskins, MD by Quintella Reichert, ED scribe.  This patient was seen in room APA03/APA03 and the patient's care was started at 8:54 AM.     Chief Complaint  Patient presents with  . Flank Pain  . Abdominal Pain    Patient is a 32 y.o. female presenting with flank pain. The history is provided by the patient. No language interpreter was used.  Flank Pain This is a new problem. The current episode started 3 to 5 hours ago. The problem occurs constantly. Associated symptoms include abdominal pain. Pertinent negatives include no chest pain, no headaches and no shortness of breath. Associated symptoms comments: Nausea and emesis. No diarrhea, vaginal bleeding or discharge, or dysuria.. Nothing aggravates the symptoms. Nothing relieves the symptoms. She has tried nothing for the symptoms.    HPI Comments: KATALENA MALVEAUX is a 32 y.o. female who presents to the Emergency Department complaining of constant, severe pain to the right lower abdomen extending to lower right back that began 3 1/2 hours ago, with accompanying nausea and emesis.  Pain radiates down her right leg to the knee.  She notes that lower abdominal pain feels similar to that accompanying a prior ovarian cyst but she denies h/o similar pain to her back or leg.  She denies diarrhea, vaginal bleeding or discharge, dysuria, or any other associated symptoms.  She denies h/o kidney stones.  She notes that she has received a tubal ligation but denies any other abdominal surgeries.   Past Medical History  Diagnosis Date  . Bipolar 1 disorder   . Hypertension   . Ovarian cyst     Past Surgical History  Procedure Laterality Date  . Uterine abalation    . Tubal ligation      Family History  Problem Relation Age of Onset  . Hypertension Sister   . Diabetes Maternal Grandmother   . Thyroid disease Maternal Grandmother   . Thyroid  disease Paternal Grandmother     History  Substance Use Topics  . Smoking status: Never Smoker   . Smokeless tobacco: Never Used  . Alcohol Use: No    OB History   Grav Para Term Preterm Abortions TAB SAB Ect Mult Living                   Review of Systems  Respiratory: Negative for shortness of breath.   Cardiovascular: Negative for chest pain.  Gastrointestinal: Positive for abdominal pain.  Genitourinary: Positive for flank pain. Negative for dysuria.  Neurological: Negative for weakness and headaches.  All other systems reviewed and are negative.      Allergies  Latex  Home Medications   Current Outpatient Rx  Name  Route  Sig  Dispense  Refill  . amLODipine (NORVASC) 5 MG tablet   Oral   Take 5 mg by mouth daily.         . clonazePAM (KLONOPIN) 2 MG tablet   Oral   Take 2 mg by mouth 3 (three) times daily as needed for anxiety.         Marland Kitchen ibuprofen (ADVIL,MOTRIN) 600 MG tablet   Oral   Take 1 tablet (600 mg total) by mouth every 6 (six) hours as needed for pain.   30 tablet   0   . lamoTRIgine (LAMICTAL) 150 MG tablet   Oral   Take 150  mg by mouth daily.         Marland Kitchen loratadine (CLARITIN) 10 MG tablet   Oral   Take 10 mg by mouth daily.         . norgestimate-ethinyl estradiol (SPRINTEC 28) 0.25-35 MG-MCG tablet   Oral   Take 1 tablet by mouth daily.         . QUEtiapine (SEROQUEL) 50 MG tablet   Oral   Take 50 mg by mouth at bedtime.         . traZODone (DESYREL) 50 MG tablet   Oral   Take 50 mg by mouth at bedtime as needed for sleep.          BP 144/94  Pulse 82  Temp(Src) 97.6 F (36.4 C) (Oral)  Resp 18  SpO2 100%   Physical Exam  Nursing note and vitals reviewed. CONSTITUTIONAL: Well developed/well nourished HEAD: Normocephalic/atraumatic EYES: EOMI/PERRL, no icterus ENMT: Mucous membranes moist NECK: supple no meningeal signs SPINE:entire spine nontender CV: S1/S2 noted, no murmurs/rubs/gallops noted LUNGS:  Lungs are clear to auscultation bilaterally, no apparent distress ABDOMEN: soft, nontender, no rebound or guarding ZO:XWRUE cva tenderness, no adnexal mass or tenderness noted. No vag bleeding or discharge.  Chaperone present NEURO: Pt is awake/alert, moves all extremitiesx4, no focal weakness in either lower extremity EXTREMITIES: pulses normal, full ROM SKIN: warm, color normal PSYCH: Anxious   ED Course  Procedures   DIAGNOSTIC STUDIES: Oxygen Saturation is 100% on room air, normal by my interpretation.    COORDINATION OF CARE: 9:00 AM-Discussed treatment plan which includes pain medication, anti-emetics, pelvic exam, and UA with pt at bedside and pt agreed to plan.   10:15 AM Pt improved Urine is unremarkable Pelvic exam unremarkable She reports similar type pain in pelvic region previously.  She reports she gets this frequently.  Most recent US results reviewed.  I doubt acute ovarian torsion/TOA.  I doubt appendicitis at this time.  I feel she is safe for d/c home.  We discussed strict return precautions.  She told me she did not have any other pain meds at home.  Short course of pain meds given and referred back to gyn.   Labs Reviewed  URINALYSIS, ROUTINE W REFLEX MICROSCOPIC - Abnormal; Notable for the following:    APPearance HAZY (*)    All other components within normal limits  PREGNANCY, URINE     MDM  Nursing notes including past medical history and social history reviewed and considered in documentation Labs/vital reviewed and considered Previous records reviewed and considered     I personally performed the services described in this documentation, which was scribed in my presence. The recorded information has been reviewed and is accurate.      Joya Gaskins, MD 10/20/12 1017

## 2012-10-22 ENCOUNTER — Encounter: Payer: Self-pay | Admitting: Obstetrics and Gynecology

## 2012-10-22 ENCOUNTER — Ambulatory Visit (INDEPENDENT_AMBULATORY_CARE_PROVIDER_SITE_OTHER): Payer: Medicaid Other | Admitting: Obstetrics and Gynecology

## 2012-10-22 VITALS — BP 138/84 | Ht 68.0 in | Wt 279.4 lb

## 2012-10-22 DIAGNOSIS — N949 Unspecified condition associated with female genital organs and menstrual cycle: Secondary | ICD-10-CM

## 2012-10-22 DIAGNOSIS — R102 Pelvic and perineal pain: Secondary | ICD-10-CM

## 2012-10-22 MED ORDER — OXYCODONE-ACETAMINOPHEN 5-325 MG PO TABS: 1.0000 | ORAL_TABLET | ORAL | Status: DC | PRN

## 2012-10-22 MED ORDER — POLYETHYLENE GLYCOL 3350 17 GM/SCOOP PO POWD: 17.0000 g | Freq: Every day | ORAL | Status: DC

## 2012-10-22 NOTE — Patient Instructions (Signed)
Continue Sprintec. Recheck and see if you'll have a withdrawal bleed at the end of the pills.

## 2012-10-22 NOTE — Progress Notes (Signed)
   [facility] Visit  Patient name: Julia Rosario MRN [id number]  Date of birth: 1980/05/02  CC & HPI:  Julia Rosario is a 32 y.o. female presenting today for emergency room followup, having been seen in the midst room for right lower quadrant pain that occurs every other month of the past 6 months, at the time in her cycle when she would expect to to have menstrual flow. She is status post endometrial ablation and experiences a cyclic discomfort almost every other month ago. Ablation was in 2012 with normal first year. Should an ultrasound in June from a prior emergency room visit, that shows a 1.29 cm hyperechoic area in the posterior myometrium that may represent adenomyosis versus an irregular endometrial remnant, but she's had no bleeding.  ROS:  No fevers chills, symptoms of infection not currently sexually active x4 months  Pertinent History Reviewed:  Medical & Surgical Hx:  Reviewed: Significant for hypertension x2 weeks managed by Dr. Mervyn Skeeters [facility], bipolar tX'd by [facility] ,in [place]. Medications: Reviewed & Updated - recently tried on Sprintec, the first month she's been on it. Social History: Reviewed -  reports that she has never smoked. She has never used smokeless tobacco.  Objective Findings:  Vitals: BP 138/84  Ht 5\' 8"  (1.727 m)  Wt 279 lb 6.4 oz (126.735 kg)  BMI 42.49 kg/m2  Physical Examination: General appearance - alert, well appearing, and in no distress, oriented to person, place, and time and overweight Mental status - alert, oriented to person, place, and time Abdomen - soft, nontender, nondistended, no masses or organomegaly Pelvic - VULVA: normal appearing vulva with no masses, tenderness or lesions, VAGINA: normal appearing vagina with normal color and discharge, no lesions, CERVIX: normal appearing cervix without discharge or lesions, UTERUS: uterus is normal size, shape, consistency and nontender, tenderness to  uterine  Contact, When pt experiences the pain , it occurs acutely, is quite painful. Requiring loritab., ADNEXA: tenderness right, no masses, exam chaperoned by Rn.  When Pt hurting , she feels she caonnot defecate, and effort makes pain worse.  When pt pushes , the pelvis is tender.  After BM, pressure sx are releived. No hx of needling a laxative. Stool texture isnt hard, has formed stools/ Pt has slight spotting infrequently c voiding. A constipation, small fibroid versus small area of adenomyosis.   If pt has adenomyosis, she may bleed at end of pill pack.    Will see pt in 4 wk to see if she has withdraws at all.

## 2012-12-02 ENCOUNTER — Encounter: Payer: Self-pay | Admitting: Obstetrics and Gynecology

## 2012-12-02 ENCOUNTER — Ambulatory Visit (INDEPENDENT_AMBULATORY_CARE_PROVIDER_SITE_OTHER): Payer: Medicaid Other | Admitting: Obstetrics and Gynecology

## 2012-12-02 VITALS — BP 130/88 | Ht 68.0 in | Wt 282.4 lb

## 2012-12-02 DIAGNOSIS — R102 Pelvic and perineal pain: Secondary | ICD-10-CM

## 2012-12-02 DIAGNOSIS — N949 Unspecified condition associated with female genital organs and menstrual cycle: Secondary | ICD-10-CM

## 2012-12-02 MED ORDER — NORETHINDRONE 0.35 MG PO TABS: 1.0000 | ORAL_TABLET | Freq: Every day | ORAL | Status: DC

## 2012-12-02 NOTE — Progress Notes (Signed)
   [facility] Visit  Patient name: EVAROSE ALTLAND MRN [id number]  Date of birth: [birth year]  CC & HPI:  LINNAEA AHN is a 32 y.o. female presenting today for followup of rlq pain, cyclic, less severe over last month, had moderate pain rlq x 2 days , occurred Nov 3-4, at end of Sprintec pk. Pt did havd a med change on HTN meds, reduced to hctz from amlodipine.    ROS:  Doesn't has support program in [place], FOB in [place]   Pertinent History Reviewed:  Medical & Surgical Hx:  Reviewed: Significant for Btl ablation. Medications: Reviewed & Updated - see associated section meds  Hctz 25, Lamictal, seroquel, Klonipin 1.0 mg, prn, Trazodone100 hs.  Social History: Reviewed -  reports that she has never smoked. She has never used smokeless tobacco. 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    Objective Findings:  Vitals: BP 130/88  Ht 5\' 8"  (1.727 m)  Wt 282 lb 6.4 oz (128.096 kg)  BMI 42.95 kg/m2  Physical Examination: General appearance - alert, well appearing, and in no distress, oriented to person, place, and time and overweight Mental status - alert, oriented to person, place, and time, normal mood, behavior, speech, dress, motor activity, and thought processes eexhausted by child needs.   Assessment & Plan:   CHTN, stable RLQ pain, adenomyosis vs endometriosis, will change to POP. Rx Micronor D/c Sprintec. Multiple social support deficiencies. Advised to discuss with IEP advisor at school. F/u 3 mos

## 2012-12-02 NOTE — Patient Instructions (Signed)
Stop sprintec after this pack  Begin Micronor.

## 2013-01-16 ENCOUNTER — Ambulatory Visit: Payer: Medicaid Other | Admitting: Obstetrics and Gynecology

## 2013-03-03 ENCOUNTER — Ambulatory Visit (INDEPENDENT_AMBULATORY_CARE_PROVIDER_SITE_OTHER): Payer: Medicaid Other | Admitting: Obstetrics and Gynecology

## 2013-03-03 ENCOUNTER — Encounter: Payer: Self-pay | Admitting: Obstetrics and Gynecology

## 2013-03-03 VITALS — BP 132/82 | Ht 68.0 in | Wt 279.0 lb

## 2013-03-03 DIAGNOSIS — R102 Pelvic and perineal pain: Secondary | ICD-10-CM | POA: Insufficient documentation

## 2013-03-03 DIAGNOSIS — N949 Unspecified condition associated with female genital organs and menstrual cycle: Secondary | ICD-10-CM

## 2013-03-03 MED ORDER — OXYCODONE-ACETAMINOPHEN 5-325 MG PO TABS: 1.0000 | ORAL_TABLET | ORAL | Status: DC | PRN

## 2013-03-03 MED ORDER — IBUPROFEN 600 MG PO TABS: 600.0000 mg | ORAL_TABLET | Freq: Four times a day (QID) | ORAL | Status: DC | PRN

## 2013-03-03 NOTE — Progress Notes (Signed)
Patient ID: Julia Rosario, female   DOB: 12/16/1980, 32 y.o.   MRN: [id number] followup pelvic ache s/p endo ablation.   [facility] Visit  Patient name: Julia Rosario MRN [id number]  Date of birth: 01/05/1981  CC & HPI:  Julia Rosario is a 32 y.o. female presenting today for pelvic ache  ROS:  Lite spotting x 1 day, after a day of pelvic pain  On NOrethindrone minipill(POP) for control of endometrium due to ? Of adenomyosis or remnant of endometrium  Pertinent History Reviewed:  Medical & Surgical Hx:  Reviewed: Significant for obesity , bipolar Medications: Reviewed & Updated - see associated section Social History: Reviewed -  reports that she has never smoked. She has never used smokeless tobacco.  Objective Findings:  Vitals: BP 132/82  Ht 5\' 8"  (1.727 m)  Wt 126.554 kg (279 lb)  BMI 42.43 kg/m2  Physical Examination: General appearance - alert, well appearing, and in no distress, oriented to person, place, and time and anxious Mental status - alert, oriented to person, place, and time, depressed mood Abdomen - soft, nontender, nondistended, no masses or organomegaly   Assessment & Plan:   Intermittent pelvic apain after ablation, Treatable with percocet and motrin. Continue POP's

## 2013-03-03 NOTE — Patient Instructions (Signed)
Continue current tx with norethindrone minipill

## 2013-09-12 ENCOUNTER — Ambulatory Visit: Payer: Medicaid Other | Admitting: Obstetrics and Gynecology

## 2013-12-10 ENCOUNTER — Other Ambulatory Visit: Payer: Self-pay | Admitting: Obstetrics and Gynecology

## 2013-12-10 ENCOUNTER — Encounter: Payer: Self-pay | Admitting: Obstetrics and Gynecology

## 2013-12-10 ENCOUNTER — Other Ambulatory Visit (INDEPENDENT_AMBULATORY_CARE_PROVIDER_SITE_OTHER): Payer: Medicaid Other

## 2013-12-10 ENCOUNTER — Ambulatory Visit (INDEPENDENT_AMBULATORY_CARE_PROVIDER_SITE_OTHER): Payer: Medicaid Other | Admitting: Obstetrics and Gynecology

## 2013-12-10 VITALS — BP 104/60 | Ht 68.0 in | Wt 268.0 lb

## 2013-12-10 DIAGNOSIS — N949 Unspecified condition associated with female genital organs and menstrual cycle: Secondary | ICD-10-CM

## 2013-12-10 DIAGNOSIS — S39011A Strain of muscle, fascia and tendon of abdomen, initial encounter: Secondary | ICD-10-CM | POA: Insufficient documentation

## 2013-12-10 MED ORDER — TRAMADOL HCL 50 MG PO TABS: 50.0000 mg | ORAL_TABLET | Freq: Four times a day (QID) | ORAL | Status: DC | PRN

## 2013-12-10 MED ORDER — IBUPROFEN 600 MG PO TABS: 600.0000 mg | ORAL_TABLET | Freq: Four times a day (QID) | ORAL | Status: DC | PRN

## 2013-12-10 NOTE — Progress Notes (Signed)
[facility]  Patient name: Julia Rosario MRN [id number]  Date of birth: [birth year]  CC & HPI:  Julia Rosario is a 33 y.o. female who presents today complaining of abdominal pain onset 4-5 days. She states that when the pain first began, she was doubled over in pain. She states that she will have stabbing pains every few months, where she would spot, but not fully bleed. She states that the pains that are associated with the bleeding are in the same lower abdominal area. She states that she has lower abdominal pain/pressure on her right side. She rates her pain as a 3/10 at this time. She states that she is having associated symptoms of nausea. She denies vaginal bleeding and any other associated symptoms. She states that she has a hx of ovarian cysts. She states that she has had vaginal delivery.    ROS:  +Abdominal Pain +Nausea +Ovarian cysts -Vaginal Bleeding No other complaints  Pertinent History Reviewed:   Reviewed: Significant for  Medical         Past Medical History  Diagnosis Date  . Bipolar 1 disorder   . Hypertension   . Ovarian cyst                               Surgical Hx:    Past Surgical History  Procedure Laterality Date  . Uterine abalation    . Tubal ligation     Medications: Reviewed & Updated - see associated section                      Current outpatient prescriptions:amLODipine (NORVASC) 5 MG tablet, Take 5 mg by mouth daily., Disp: , Rfl: ;  clonazePAM (KLONOPIN) 2 MG tablet, Take 2 mg by mouth 3 (three) times daily as needed for anxiety., Disp: , Rfl: ;  ibuprofen (ADVIL,MOTRIN) 600 MG tablet, Take 1 tablet (600 mg total) by mouth every 6 (six) hours as needed for pain., Disp: 30 tablet, Rfl: 0;  lamoTRIgine (LAMICTAL) 150 MG tablet, Take 150 mg by mouth daily., Disp: , Rfl:  lisinopril-hydrochlorothiazide (PRINZIDE,ZESTORETIC) 10-12.5 MG per tablet, Take 1 tablet by mouth daily., Disp: , Rfl: ;  loratadine (CLARITIN) 10 MG tablet, Take  10 mg by mouth daily., Disp: , Rfl: ;  NAPROXEN PO, Take 200 mg by mouth as needed., Disp: , Rfl: ;  norethindrone (MICRONOR,CAMILA,ERRIN) 0.35 MG tablet, Take 1 tablet (0.35 mg total) by mouth daily., Disp: 1 Package, Rfl: 11 QUEtiapine (SEROQUEL) 50 MG tablet, Take 50 mg by mouth at bedtime., Disp: , Rfl: ;  traZODone (DESYREL) 50 MG tablet, Take 50 mg by mouth at bedtime as needed for sleep., Disp: , Rfl: ;  oxyCODONE-acetaminophen (PERCOCET/ROXICET) 5-325 MG per tablet, Take 1 tablet by mouth every 4 (four) hours as needed., Disp: 30 tablet, Rfl: 0   Social History: Reviewed -  reports that she has never smoked. She has never used smokeless tobacco.  Objective Findings:  Vitals: Blood pressure 104/60, height 5\' 8"  (1.727 m), weight 268 lb (121.564 kg).  Physical Examination: Abdomen - soft, nontender, nondistended, no masses or organomegaly. With stomach tight, she has tenderness to abdominal wall palpation.  Pelvic - normal external genitalia, vulva, vagina, cervix, uterus and adnexa,  VULVA: normal appearing vulva with no masses, tenderness or lesions,  VAGINA: normal appearing vagina with normal color and discharge, no lesions, non tender,  CERVIX: normal  appearing cervix without discharge or lesions, non-purulent,  UTERUS: uterus is normal size, shape, consistency and nontender, exam limited by body habitus.   [place] is reviewed and is normal.  Assessment & Plan:   A:  1. Pelvic US in the office today 2. Muscle wall tenderness in RLQ  P:  1. Pelvic US 2. NSAIDs and Tramadol 3. Weight loss  4. Support belt     This chart was scribed for Jonnie Kind, MD by Steva Colder, ED Scribe. The patient was seen in room 1 at 9:05 AM.  Over 40 min spent in eval, review of u/s , discussion and planning and documentation

## 2014-03-16 ENCOUNTER — Other Ambulatory Visit: Payer: Medicaid Other | Admitting: Obstetrics and Gynecology

## 2014-03-25 ENCOUNTER — Other Ambulatory Visit: Payer: Medicaid Other | Admitting: Obstetrics and Gynecology

## 2014-03-25 ENCOUNTER — Encounter: Payer: Self-pay | Admitting: Obstetrics and Gynecology

## 2019-04-10 ENCOUNTER — Ambulatory Visit: Payer: Self-pay | Admitting: Gastroenterology

## 2019-04-12 NOTE — Progress Notes (Signed)
Referring Provider: Alonza Smoker, FNP at [facility] Department Primary Care Physician:  Jerel Shepherd, FNP Primary Gastroenterologist:  Dr. Gala Romney  Chief Complaint  Patient presents with  . Diarrhea    abd pain,pain in back and under ribs on rt side, nausea    HPI:   Julia Rosario is a 39 y.o. female presenting today at the request of Alonza Smoker, FNP at [facility] for abdominal pain, diarrhea, and dysphagia.   Reviewed referral.  Patient referred for upper abdominal pain worsening despite omeprazole 20 mg twice daily.  Nausea improved but continues on Zofran 4 mg as needed.  Also with some explosive diarrhea with historically bowels being sluggish and dry.  Also with trouble swallowing. Labs completed on 04/09/2019.  CBC and CMP within normal limits.  Lipase normal.  Today:  Epigastric pain. Comes and goes throughout the day. Radiates around to right side and into her back around her shoulder blade. Started last week. Thought she had pulled something as she has been helping her father who recently had bilateral leg amputation. Not specifically associated with meals. Has woken up her from sleep. Typically worse in the morning. Pain lasts 3-5 minutes. Still has her gallbladder. No recent imaging. Started on omeprazole 20 mg BID by PCP. Hasn't noticed improvement in her symptoms. Will feel hot before pain starts. Taking ibuprofen and aleve as needed. This is rare.   Nauseated all day. Started at the same time of the pain. No vomiting. No reflux symptoms. States she had bad reflux when she was pregnant about 15 years ago but none since.  Feels the food she eats sits in her mid chest. Started about 1 week. Occurs with all foods. No trouble with liquids. Eventually foods feel like they will go down but to some degree feels like there is something in her esophagus all the time. No unintentional weight loss. No prior EGD.   Reports one episode of diarrhea last week. Now she  feels like she needs to go but she can't. Passing very small BMs. States she has never been "reglar." Will "flip flop a lot" from loose to hard stools. In the past, has had symptoms of postprandial urgency. Typically 1 BM a day, but may be small. BMs incomplete at times. Some change in stool caliber recently. More thin. No known hemorrhoids. Not currently taking anything for constipation. No blood in the stool or blacks stools.  No family history of colon cancer.  No fever, chills, lightheadedness, dizziness, pre-syncope, or syncope. No chest pain or heart palpitations. No SOB or cough. Reports sinus drainage chronically.    Past Medical History:  Diagnosis Date  . Bipolar 1 disorder ([place])   . Hypertension   . Ovarian cyst     Past Surgical History:  Procedure Laterality Date  . TUBAL LIGATION    . uterine abalation      Current Outpatient Medications  Medication Sig Dispense Refill  . cetirizine (ZYRTEC) 10 MG tablet Take 10 mg by mouth daily.    Marland Kitchen NAPROXEN PO Take 200 mg by mouth as needed.    Marland Kitchen omeprazole (PRILOSEC) 20 MG capsule 2 (two) times daily.    . ondansetron (ZOFRAN) 4 MG tablet Take 4 mg by mouth 2 (two) times daily.     No current facility-administered medications for this visit.    Allergies as of 04/14/2019 - Review Complete 04/14/2019  Allergen Reaction Noted  . Latex Rash 05/12/2011    Family History  Problem  Relation Age of Onset  . Hypertension Sister   . Diabetes Maternal Grandmother   . Thyroid disease Maternal Grandmother   . Thyroid disease Paternal Grandmother   . Colon cancer Neg Hx     Social History   Socioeconomic History  . Marital status: Single    Spouse name: Not on file  . Number of children: Not on file  . Years of education: Not on file  . Highest education level: Not on file  Occupational History  . Not on file  Tobacco Use  . Smoking status: Never Smoker  . Smokeless tobacco: Never Used  Substance and Sexual Activity  .  Alcohol use: No  . Drug use: No  . Sexual activity: Yes    Birth control/protection: Surgical  Other Topics Concern  . Not on file  Social History Narrative  . Not on file   Social Determinants of Health   Financial Resource Strain:   . Difficulty of Paying Living Expenses: Not on file  Food Insecurity:   . Worried About Charity fundraiser in the Last Year: Not on file  . Ran Out of Food in the Last Year: Not on file  Transportation Needs:   . Lack of Transportation (Medical): Not on file  . Lack of Transportation (Non-Medical): Not on file  Physical Activity:   . Days of Exercise per Week: Not on file  . Minutes of Exercise per Session: Not on file  Stress:   . Feeling of Stress : Not on file  Social Connections:   . Frequency of Communication with Friends and Family: Not on file  . Frequency of Social Gatherings with Friends and Family: Not on file  . Attends Religious Services: Not on file  . Active Member of Clubs or Organizations: Not on file  . Attends Archivist Meetings: Not on file  . Marital Status: Not on file  Intimate Partner Violence:   . Fear of Current or Ex-Partner: Not on file  . Emotionally Abused: Not on file  . Physically Abused: Not on file  . Sexually Abused: Not on file    Review of Systems: Gen: See HPI CV: See HPI Resp: See HPI.  GI: See HPI GU : Denies urinary burning, urinary frequency, urinary hesitancy MS: Denies joint pain Derm: Denies rash.  Psych: Reports being bipolar. Took herself off medications due to side effect of weight gain.   Heme: Denies bruising or bleeding  Physical Exam: BP 133/83   Pulse 76   Temp (!) 97.1 F (36.2 C)   Ht '5\' 8"'$  (1.727 m)   Wt 296 lb 6.4 oz (134.4 kg)   BMI 45.07 kg/m  General:   Alert and oriented. Pleasant and cooperative. Well-nourished and well-developed.  Head:  Normocephalic and atraumatic. Eyes:  Without icterus, sclera clear and conjunctiva pink.  Ears:  Normal auditory  acuity. Lungs:  Clear to auscultation bilaterally. No wheezes, rales, or rhonchi. No distress.  Heart:  S1, S2 present without murmurs appreciated.  Abdomen:  +BS, soft, and non-distended.  Moderate tenderness to palpation in the epigastric area and RUQ.  Mild tenderness to palpation in the mid lower abdomen. No HSM noted. No guarding or rebound. No masses appreciated.  Rectal:  Deferred  Msk:  Symmetrical without gross deformities. Normal posture. Extremities:  Without edema. Neurologic:  Alert and  oriented x4;  grossly normal neurologically. Skin:  Intact without significant lesions or rashes. Psych:  Normal mood and affect.  Labs: 04/09/2019 CBC:  WBC 8.2, hemoglobin 14.3, MCV 85, MCH 27.2, MCHC 32.1, platelets 468. CMP: Glucose 92, creatinine 0.83, sodium 139, potassium 4.3, chloride 105, calcium 9.5, albumin 4.3, total bilirubin 0.5, alk phos 41, AST 21, ALT 16. Lipase 29 Lipid panel: Total cholesterol 180, triglycerides 111, HDL 57, LDL 103, VLDL 20

## 2019-04-14 ENCOUNTER — Ambulatory Visit: Payer: Medicaid Other | Admitting: Gastroenterology

## 2019-04-14 ENCOUNTER — Other Ambulatory Visit: Payer: Self-pay

## 2019-04-14 ENCOUNTER — Encounter: Payer: Self-pay | Admitting: Gastroenterology

## 2019-04-14 ENCOUNTER — Telehealth: Payer: Self-pay

## 2019-04-14 VITALS — BP 133/83 | HR 76 | Temp 97.1°F | Ht 68.0 in | Wt 296.4 lb

## 2019-04-14 DIAGNOSIS — R1310 Dysphagia, unspecified: Secondary | ICD-10-CM

## 2019-04-14 DIAGNOSIS — R198 Other specified symptoms and signs involving the digestive system and abdomen: Secondary | ICD-10-CM | POA: Insufficient documentation

## 2019-04-14 DIAGNOSIS — R1013 Epigastric pain: Secondary | ICD-10-CM

## 2019-04-14 NOTE — Patient Instructions (Addendum)
Please have ultrasound completed.  We will call you with results.  We will get you scheduled for an upper endoscopy with possible dilation of your esophagus in the near future with Dr. Gala Romney.  Continue taking omeprazole 20 mg twice daily 30 minutes before breakfast and 30 minutes before dinner.  Continue Zofran as needed.  Avoid all NSAIDs including ibuprofen, Aleve, naproxen, Advil, and Goody powders.  Start MiraLAX 1 capful (17 g) daily in 8 ounces of water.  If you develop frequent loose stools, you can decrease frequency of MiraLAX.  Ensure you are drinking enough water to keep your urine pale yellow to clear.  We will plan to follow-up with you in the office after your upper endoscopy.  Call if you have questions or concerns prior.  Aliene Altes, PA-C [facility]

## 2019-04-14 NOTE — Assessment & Plan Note (Signed)
39 year old female reporting 1 week history of feeling foods sit in her mid chest.  No trouble with liquids.  Eventually, foods will move down but states she feels there is something in her esophagus for some degree all the time.  Also with daily nausea with 1 week history of intermittent epigastric pain that radiates to her right side and around to her right shoulder blade as discussed below.  Symptoms are not associated with meals.  Denies GERD symptoms, bright red blood per rectum, melena, or unintentional weight loss.  Started on omeprazole 20 mg twice daily by PCP last week without significant improvement.  Labs on 04/09/2019 with CBC, CMP, lipase within normal limits.  Suspect patient may have silent reflux causing esophagitis, esophageal web, ring, or stricture.  Cannot rule out malignancy.    Proceed with EGD +/- dilation with propofol with Dr. Gala Romney in the near future. The risks, benefits, and alternatives have been discussed in detail with patient. They have stated understanding and desire to proceed.  Propofol due to BMI and history of bipolar disorder.  Historically has been on antidepressants, anxiolytics, and antipsychotics but she has taken herself off of these. Continue omeprazole 20 mg twice daily. Continue Zofran as needed. Avoid all NSAIDs. Follow-up after EGD.

## 2019-04-14 NOTE — Telephone Encounter (Signed)
[place] abd RUQ scheduled for 04/17/19 at 9:30am, arrive at 9:15am. NPO after midnight prior to test. Called and informed pt of appt.

## 2019-04-14 NOTE — Telephone Encounter (Signed)
PA for [place] abd RUQ submitted via [employer]. Case approved. PA# [id number], valid 04/14/19-10/11/19.

## 2019-04-14 NOTE — Assessment & Plan Note (Addendum)
1 week history of intermittent epigastric pain that radiates to her right side and around to her right shoulder blade.  Associated daily nausea without vomiting.  Symptoms are not associated with meals.  Pain has woken her from sleep and is typically worse in the morning.  Last 3-5 minutes.  Started on omeprazole 20 mg twice daily by PCP last week.  No improvement thus far.  No regular NSAID use.  She does admit to symptoms of food sitting in her chest for the last week as discussed above.  Denies heartburn or reflux symptoms, melena, BRBPR, or unintentional weight loss.  No recent abdominal imaging.  Labs on 04/09/2019 with CBC, CMP, lipase all within normal limits.  Abdominal exam with tenderness to palpation in the epigastric and RUQ area.   Differentials include biliary etiology, gastritis, duodenitis, esophagitis, H. pylori, PUD.  Right upper quadrant ultrasound to evaluate gallbladder. Proceed with EGD +/- dilation with propofol with Dr. Gala Romney in the near future. The risks, benefits, and alternatives have been discussed in detail with patient. They have stated understanding and desire to proceed. Propofol due to BMI and history of bipolar disorder.  Historically has been on antidepressants, anxiolytics, and antipsychotics but she has taken herself off of these. Continue omeprazole 20 mg twice daily. Continue Zofran as needed. Avoid all NSAIDs. Follow-up after EGD.  May adjust omeprazole pending ultrasound results.

## 2019-04-14 NOTE — Assessment & Plan Note (Addendum)
39 year old female with history of HTN and bipolar disorder who reports chronic alternation between diarrhea and loose stools.  Also reports remote history of postprandial urgency.  Typically 1 BM daily, sometimes small, sometimes incomplete. One episode of diarrhea last week.  Now she feels she needs to have a BM but cannot.  Feels stools are more thin.  Denies bright red blood per rectum, melena, unintentional weight loss.  No family history of colon cancer.  Recent labs on 04/09/2019 with CBC and CMP within normal limits.  Abdominal exam with mild tenderness to palpation in the mid lower abdomen which I suspect is related to some underlying constipation.  Also with tenderness in the epigastric and RUQ as discussed below.   Suspect patient may have underlying IBS.  She is not currently on anything for constipation, she was advised to start MiraLAX 1 capful (17 g) daily in 8 ounces of water.  Advised to drink enough water to keep urine pale yellow to clear.  Follow-up after EGD.  Call if questions or concerns prior.

## 2019-04-17 ENCOUNTER — Other Ambulatory Visit: Payer: Self-pay

## 2019-04-17 ENCOUNTER — Ambulatory Visit (HOSPITAL_COMMUNITY)
Admission: RE | Admit: 2019-04-17 | Discharge: 2019-04-17 | Disposition: A | Discharge status: Home / Self Care | Payer: Medicaid Other | Source: Ambulatory Visit | Attending: Gastroenterology | Admitting: Gastroenterology

## 2019-04-17 DIAGNOSIS — R1013 Epigastric pain: Secondary | ICD-10-CM | POA: Diagnosis not present

## 2019-04-17 NOTE — Progress Notes (Signed)
Gallbladder without gallstones or wall thickening.   Has she noted any improvement in epigastric pain as she has continued on omeprazole? Would like to give omeprazole at least 2 weeks to see if this helps. If not, she should call back and we can make adjustments while waiting on EGD.   Also, [place] does reveal fatty liver.   Instructions for fatty liver: Recommend 1-2# weight loss per week until ideal body weight through exercise & diet. Low fat/cholesterol diet.  Avoid sweets, sodas, fruit juices, sweetened beverages like tea, etc. Gradually increase exercise from 15 min daily up to 1 hr per day 5 days/week. Limit alcohol use.  Please send fatty liver handout.

## 2019-04-21 ENCOUNTER — Other Ambulatory Visit: Payer: Self-pay | Admitting: Gastroenterology

## 2019-04-21 DIAGNOSIS — R1013 Epigastric pain: Secondary | ICD-10-CM

## 2019-04-21 MED ORDER — OMEPRAZOLE 20 MG PO CPDR: 20.0000 mg | DELAYED_RELEASE_CAPSULE | Freq: Two times a day (BID) | ORAL | 3 refills | Status: AC

## 2019-06-05 ENCOUNTER — Telehealth: Payer: Self-pay | Admitting: Internal Medicine

## 2019-06-05 NOTE — Telephone Encounter (Signed)
Pre-op and COVID test 08/19/19. Letter mailed with procedure instructions.

## 2019-06-05 NOTE — Telephone Encounter (Signed)
Pt is scheduled EGD with RMR on 3/25 and wants to reschedule. [phone]

## 2019-06-05 NOTE — Telephone Encounter (Signed)
Called pt, her transportation will be starting a new job the day before procedure so he will not be able to take off work. EGD/-/+DIL w/Propofol w/RMR rescheduled to 08/21/19 at 2:00pm. LMOVM for endo scheduler.

## 2019-06-10 ENCOUNTER — Other Ambulatory Visit: Payer: Self-pay

## 2019-06-10 ENCOUNTER — Other Ambulatory Visit (HOSPITAL_COMMUNITY)
Admission: RE | Admit: 2019-06-10 | Discharge: 2019-06-10 | Disposition: A | Discharge status: Home / Self Care | Payer: Medicaid Other | Source: Ambulatory Visit | Attending: Internal Medicine | Admitting: Internal Medicine

## 2019-06-10 ENCOUNTER — Encounter (HOSPITAL_COMMUNITY): Admission: RE | Admit: 2019-06-10 | Payer: Medicaid Other | Source: Ambulatory Visit

## 2019-08-13 ENCOUNTER — Telehealth: Payer: Self-pay | Admitting: *Deleted

## 2019-08-13 NOTE — Patient Instructions (Signed)
Julia Rosario  08/13/2019     @PREFPERIOPPHARMACY @   Your procedure is scheduled on   08/21/2019   Report to [facility] at  1230  P.M.  Call this number if you have problems the morning of surgery:  [phone]   Remember:  Follow the diet instructions given to you by Dr [name] office.                        Take these medicines the morning of surgery with A SIP OF WATER  Zyrtec.    Do not wear jewelry, make-up or nail polish.  Do not wear lotions, powders, or perfumes. Please wear deodorant and brush your teeth.  Do not shave 48 hours prior to surgery.  Men may shave face and neck.  Do not bring valuables to the hospital.  [facility] is not responsible for any belongings or valuables.  Contacts, dentures or bridgework may not be worn into surgery.  Leave your suitcase in the car.  After surgery it may be brought to your room.  For patients admitted to the hospital, discharge time will be determined by your treatment team.  Patients discharged the day of surgery will not be allowed to drive home.   Name and phone number of your driver:   family Special instructions:  DO NOT smoke the morning of your procedure.  Please read over the following fact sheets that you were given. Anesthesia Post-op Instructions and Care and Recovery After Surgery       Upper Endoscopy, Adult, Care After This sheet gives you information about how to care for yourself after your procedure. Your health care provider may also give you more specific instructions. If you have problems or questions, contact your health care provider. What can I expect after the procedure? After the procedure, it is common to have:  A sore throat.  Mild stomach pain or discomfort.  Bloating.  Nausea. Follow these instructions at home:   Follow instructions from your health care provider about what to eat or drink after your procedure.  Return to your normal activities as told by your health  care provider. Ask your health care provider what activities are safe for you.  Take over-the-counter and prescription medicines only as told by your health care provider.  Do not drive for 24 hours if you were given a sedative during your procedure.  Keep all follow-up visits as told by your health care provider. This is important. Contact a health care provider if you have:  A sore throat that lasts longer than one day.  Trouble swallowing. Get help right away if:  You vomit blood or your vomit looks like coffee grounds.  You have: ? A fever. ? Bloody, black, or tarry stools. ? A severe sore throat or you cannot swallow. ? Difficulty breathing. ? Severe pain in your chest or abdomen. Summary  After the procedure, it is common to have a sore throat, mild stomach discomfort, bloating, and nausea.  Do not drive for 24 hours if you were given a sedative during the procedure.  Follow instructions from your health care provider about what to eat or drink after your procedure.  Return to your normal activities as told by your health care provider. This information is not intended to replace advice given to you by your health care provider. Make sure you discuss any questions you have with your health care provider.  Document Revised: 08/28/2017 Document Reviewed: 08/06/2017 Elsevier Patient Education  [place].  Esophageal Dilatation Esophageal dilatation, also called esophageal dilation, is a procedure to widen or open (dilate) a blocked or narrowed part of the esophagus. The esophagus is the part of the body that moves food and liquid from the mouth to the stomach. You may need this procedure if:  You have a buildup of scar tissue in your esophagus that makes it difficult, painful, or impossible to swallow. This can be caused by gastroesophageal reflux disease (GERD).  You have cancer of the esophagus.  There is a problem with how food moves through your esophagus. In  some cases, you may need this procedure repeated at a later time to dilate the esophagus gradually. Tell a health care provider about:  Any allergies you have.  All medicines you are taking, including vitamins, herbs, eye drops, creams, and over-the-counter medicines.  Any problems you or family members have had with anesthetic medicines.  Any blood disorders you have.  Any surgeries you have had.  Any medical conditions you have.  Any antibiotic medicines you are required to take before dental procedures.  Whether you are pregnant or may be pregnant. What are the risks? Generally, this is a safe procedure. However, problems may occur, including:  Bleeding due to a tear in the lining of the esophagus.  A hole (perforation) in the esophagus. What happens before the procedure?  Follow instructions from your health care provider about eating or drinking restrictions.  Ask your health care provider about changing or stopping your regular medicines. This is especially important if you are taking diabetes medicines or blood thinners.  Plan to have someone take you home from the hospital or clinic.  Plan to have a responsible adult care for you for at least 24 hours after you leave the hospital or clinic. This is important. What happens during the procedure?  You may be given a medicine to help you relax (sedative).  A numbing medicine may be sprayed into the back of your throat, or you may gargle the medicine.  Your health care provider may perform the dilatation using various surgical instruments, such as: ? Simple dilators. This instrument is carefully placed in the esophagus to stretch it. ? Guided wire bougies. This involves using an endoscope to insert a wire into the esophagus. A dilator is passed over this wire to enlarge the esophagus. Then the wire is removed. ? Balloon dilators. An endoscope with a small balloon at the end is inserted into the esophagus. The balloon is  inflated to stretch the esophagus and open it up. The procedure may vary among health care providers and hospitals. What happens after the procedure?  Your blood pressure, heart rate, breathing rate, and blood oxygen level will be monitored until the medicines you were given have worn off.  Your throat may feel slightly sore and numb. This will improve slowly over time.  You will not be allowed to eat or drink until your throat is no longer numb.  When you are able to drink, urinate, and sit on the edge of the bed without nausea or dizziness, you may be able to return home. Follow these instructions at home:  Take over-the-counter and prescription medicines only as told by your health care provider.  Do not drive for 24 hours if you were given a sedative during your procedure.  You should have a responsible adult with you for 24 hours after the procedure.  Follow  instructions from your health care provider about any eating or drinking restrictions.  Do not use any products that contain nicotine or tobacco, such as cigarettes and e-cigarettes. If you need help quitting, ask your health care provider.  Keep all follow-up visits as told by your health care provider. This is important. Get help right away if you:  Have a fever.  Have chest pain.  Have pain that is not relieved by medication.  Have trouble breathing.  Have trouble swallowing.  Vomit blood. Summary  Esophageal dilatation, also called esophageal dilation, is a procedure to widen or open (dilate) a blocked or narrowed part of the esophagus.  Plan to have someone take you home from the hospital or clinic.  For this procedure, a numbing medicine may be sprayed into the back of your throat, or you may gargle the medicine.  Do not drive for 24 hours if you were given a sedative during your procedure. This information is not intended to replace advice given to you by your health care provider. Make sure you discuss  any questions you have with your health care provider. Document Revised: 01/01/2019 Document Reviewed: 01/09/2017 Elsevier Patient Education  2020 [place] After These instructions provide you with information about caring for yourself after your procedure. Your health care provider may also give you more specific instructions. Your treatment has been planned according to current medical practices, but problems sometimes occur. Call your health care provider if you have any problems or questions after your procedure. What can I expect after the procedure? After your procedure, you may:  Feel sleepy for several hours.  Feel clumsy and have poor balance for several hours.  Feel forgetful about what happened after the procedure.  Have poor judgment for several hours.  Feel nauseous or vomit.  Have a sore throat if you had a breathing tube during the procedure. Follow these instructions at home: For at least 24 hours after the procedure:      Have a responsible adult stay with you. It is important to have someone help care for you until you are awake and alert.  Rest as needed.  Do not: ? Participate in activities in which you could fall or become injured. ? Drive. ? Use heavy machinery. ? Drink alcohol. ? Take sleeping pills or medicines that cause drowsiness. ? Make important decisions or sign legal documents. ? Take care of children on your own. Eating and drinking  Follow the diet that is recommended by your health care provider.  If you vomit, drink water, juice, or soup when you can drink without vomiting.  Make sure you have little or no nausea before eating solid foods. General instructions  Take over-the-counter and prescription medicines only as told by your health care provider.  If you have sleep apnea, surgery and certain medicines can increase your risk for breathing problems. Follow instructions from your health care  provider about wearing your sleep device: ? Anytime you are sleeping, including during daytime naps. ? While taking prescription pain medicines, sleeping medicines, or medicines that make you drowsy.  If you smoke, do not smoke without supervision.  Keep all follow-up visits as told by your health care provider. This is important. Contact a health care provider if:  You keep feeling nauseous or you keep vomiting.  You feel light-headed.  You develop a rash.  You have a fever. Get help right away if:  You have trouble breathing. Summary  For several hours  after your procedure, you may feel sleepy and have poor judgment.  Have a responsible adult stay with you for at least 24 hours or until you are awake and alert. This information is not intended to replace advice given to you by your health care provider. Make sure you discuss any questions you have with your health care provider. Document Revised: 06/04/2017 Document Reviewed: 06/27/2015 Elsevier Patient Education  [place].

## 2019-08-13 NOTE — Telephone Encounter (Signed)
-----   Message from Daneil Dolin, MD sent at 08/13/2019 12:23 PM EDT ----- Regarding: RE: Pre-admission and Covid test Thanks for the heads up ----- Message ----- From: Newman Nickels Sent: 08/13/2019  11:38 AM EDT To: Daneil Dolin, MD Subject: Pre-admission and Covid test                    Just wanted  your office know  Dr. [name] so pt's appt could be rescheduled.  Called patient to see if she could come in at 2:30 08/19/2019 or reschedule for the next day for her COVID test and Pre-admission.  It's turns out patient was going to call us to cancel. Patient was bit by a tick Sunday and has been very sick. [id number], stomach pain,and headache. (but no fever and she's not achy) Patient was bit on or below her left breast, it is sore, red, swollen and there's a knot about the size of a dime. Pt is going to call her primary Dr. and she said she would also call her Gastro Dr. [name] office and let them know as well. I copied the note from her chart and paste it to this message. Tammy Scott

## 2019-08-13 NOTE — Telephone Encounter (Signed)
Pt called office, she will be seeing PCP today. Advised her she will need another OV to reschedule procedure (last seen 03/2019 and she has previously rescheduled procedure). She will see what PCP says and let us know if she needs to cancel procedure.

## 2019-08-13 NOTE — Progress Notes (Addendum)
Called patient to see if she could come in at 2:30 08/19/2019 or reschedule for the next day.  It's turns out patient was going to call us to cancel. Patient was bit by a tick Sunday and has been very sick. [id number], stomach pain,and headache. (but no fever and she's not achy) Patient was bit on or below her left breast, it is sore, red, swollen and there's a knot about the size of a dime. Pt is going to call her primary Dr. and she said she would also call her Gastro Dr. [name] office and let them know as well.  Pt tried to call the Dr's office that made SS appointments, yesterday and today and the recording said the office was closed. She said they weren't going to close til Monday. I just told her to call them Tuesday Morning and make them aware she's being treated for [facility] spotted fever. Sending this to Pamala Hurry so someone can call the Dr's office to see if they can reach the Dr.'s office.

## 2019-08-19 ENCOUNTER — Encounter (HOSPITAL_COMMUNITY)
Admission: RE | Admit: 2019-08-19 | Discharge: 2019-08-19 | Disposition: A | Discharge status: Home / Self Care | Payer: Medicaid Other | Source: Ambulatory Visit | Attending: Internal Medicine | Admitting: Internal Medicine

## 2019-08-19 ENCOUNTER — Telehealth: Payer: Self-pay | Admitting: Internal Medicine

## 2019-08-19 ENCOUNTER — Other Ambulatory Visit (HOSPITAL_COMMUNITY)
Admission: RE | Admit: 2019-08-19 | Discharge: 2019-08-19 | Disposition: A | Discharge status: Home / Self Care | Payer: Medicaid Other | Source: Ambulatory Visit | Attending: Internal Medicine | Admitting: Internal Medicine

## 2019-08-19 ENCOUNTER — Encounter (HOSPITAL_COMMUNITY): Payer: Self-pay

## 2019-08-19 NOTE — Telephone Encounter (Signed)
Noted. Patient doesn't have follow-up. Please schedule when she is ready.

## 2019-08-19 NOTE — Telephone Encounter (Signed)
Called pt. She is being treated for [place] spotted fever/lyme disease.  Called endo and spoke with Lorre Nick and aware patient cancelling. FYI to [place]

## 2019-08-19 NOTE — Telephone Encounter (Signed)
Patient wanted to cancel procedure

## 2019-08-21 ENCOUNTER — Ambulatory Visit (HOSPITAL_COMMUNITY): Admission: RE | Admit: 2019-08-21 | Payer: Medicaid Other | Source: Home / Self Care | Admitting: Internal Medicine

## 2019-08-21 ENCOUNTER — Encounter (HOSPITAL_COMMUNITY): Admission: RE | Payer: Self-pay | Source: Home / Self Care

## 2019-08-21 SURGERY — ESOPHAGOGASTRODUODENOSCOPY (EGD) WITH PROPOFOL
Anesthesia: Monitor Anesthesia Care

## 2020-05-23 IMAGING — US US ABDOMEN LIMITED
1 series · 14 of 25 positions shown · non-contrast
Comparison: None.

CLINICAL DATA: Right upper quadrant pain

EXAM:
ULTRASOUND ABDOMEN LIMITED RIGHT UPPER QUADRANT

[Series 1: us abdomen limited · 0.21mm/px · 14 of 78 slices shown]
[im 1/78]
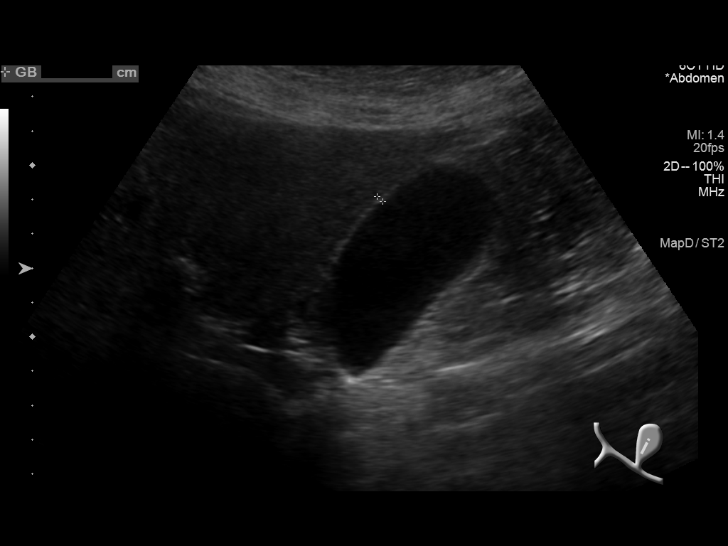
[im 7/78]
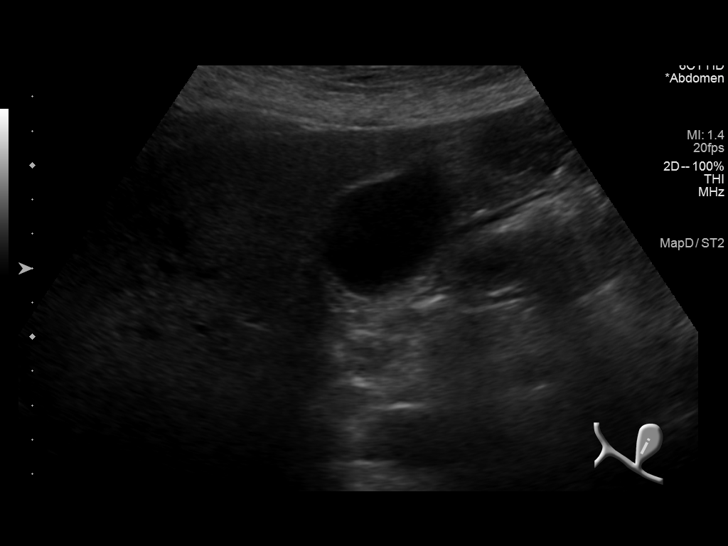
[im 13/78]
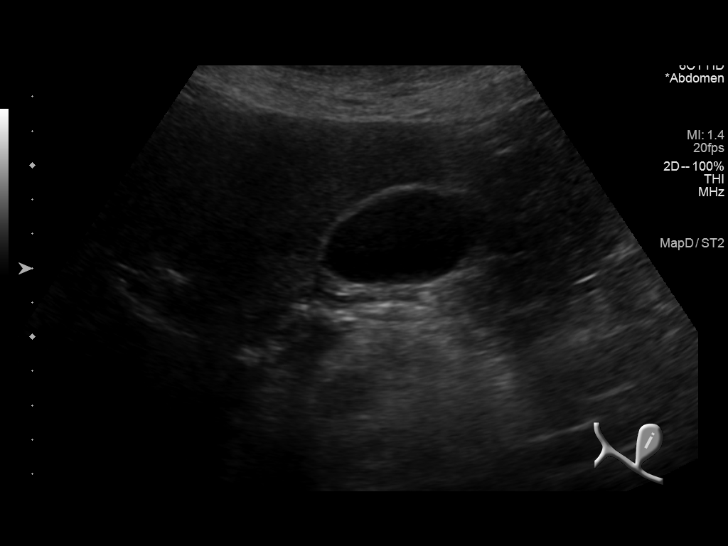
[im 20/78]
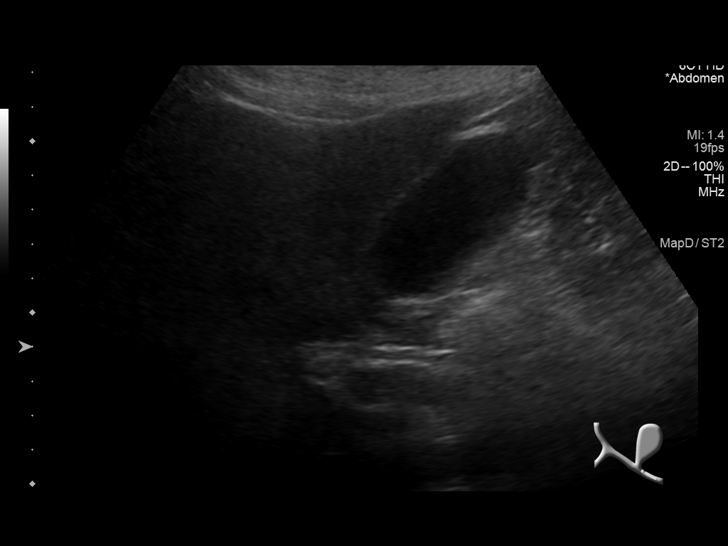
[im 26/78]
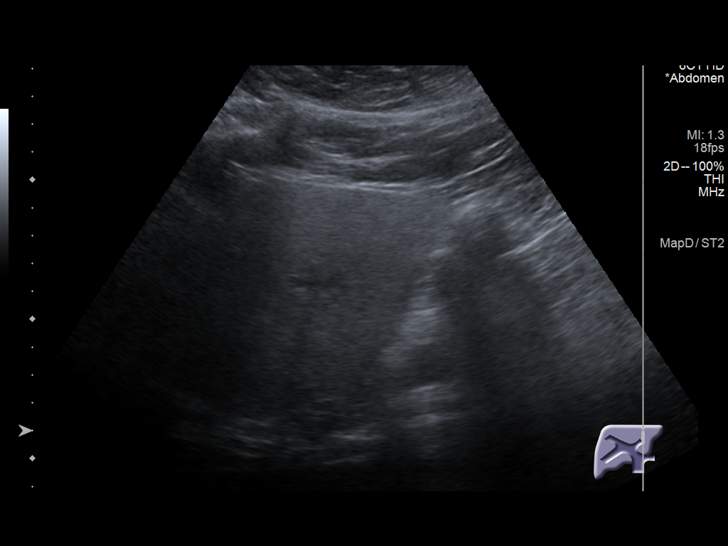
[im 29/78]
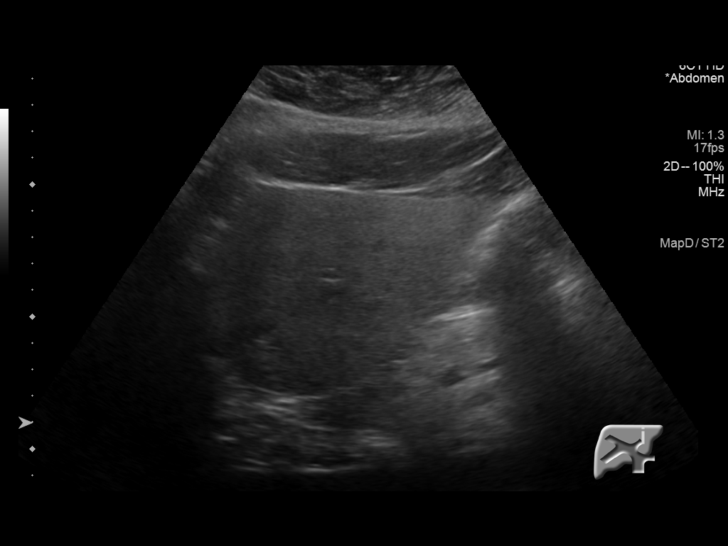
[im 36/78]
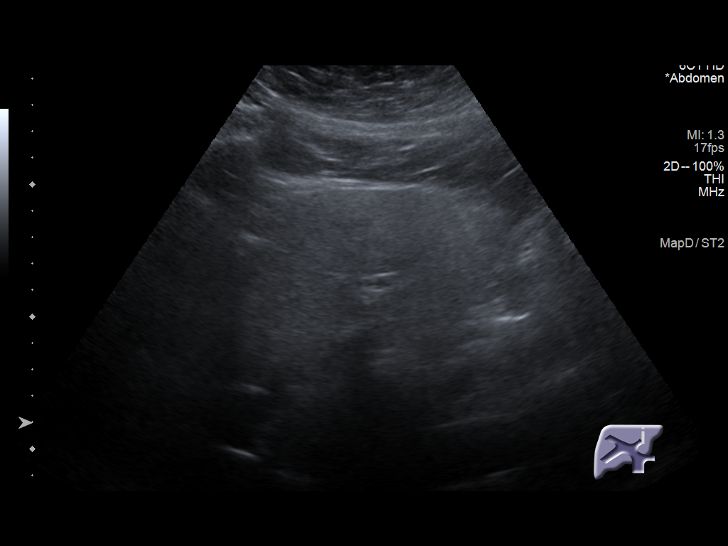
[im 42/78]
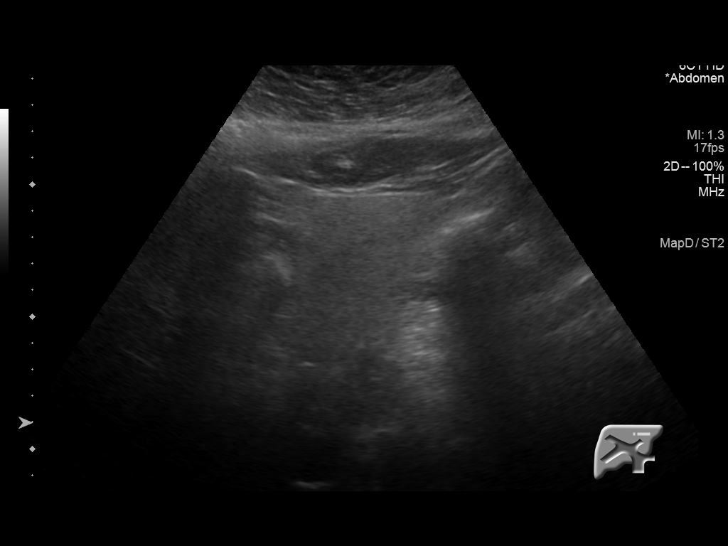
[im 49/78]
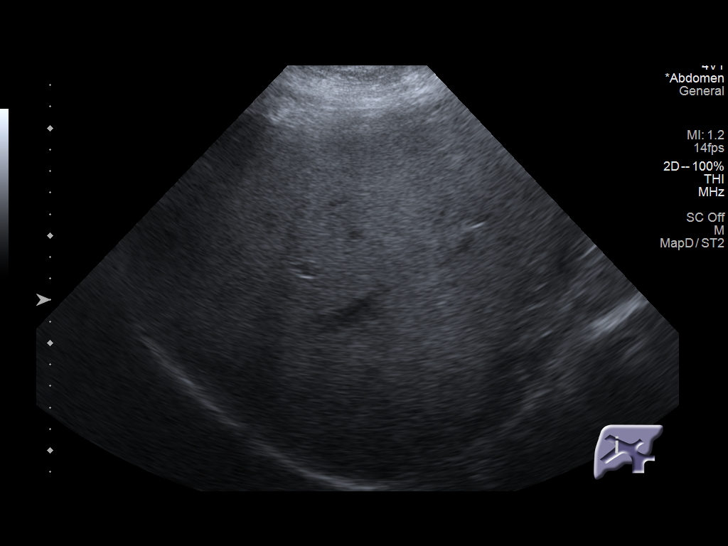
[im 52/78]
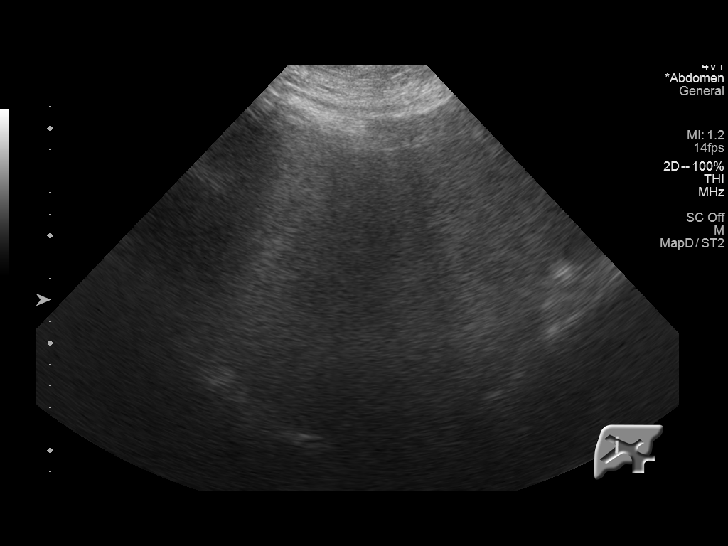
[im 58/78]
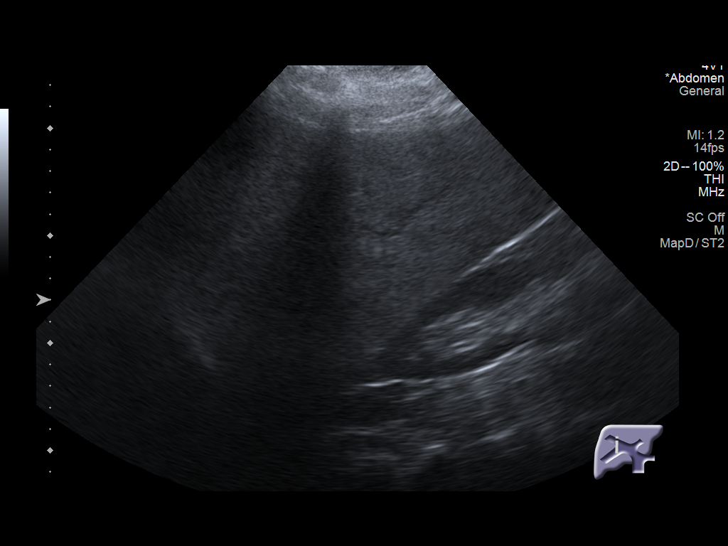
[im 65/78]
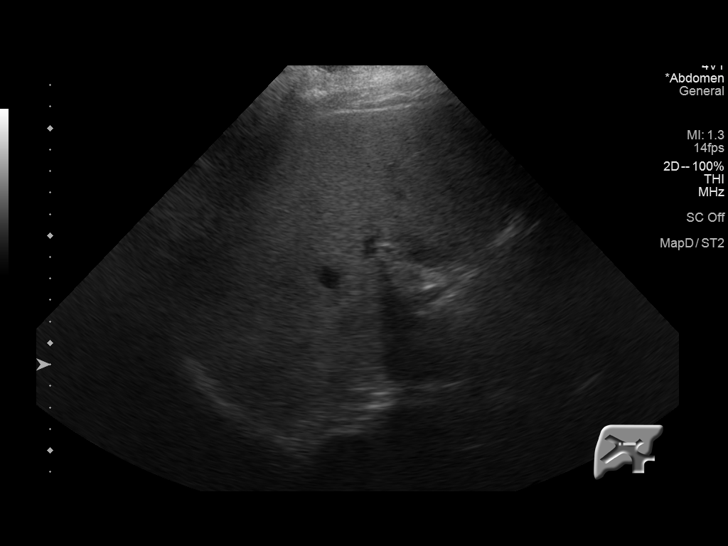
[im 71/78]
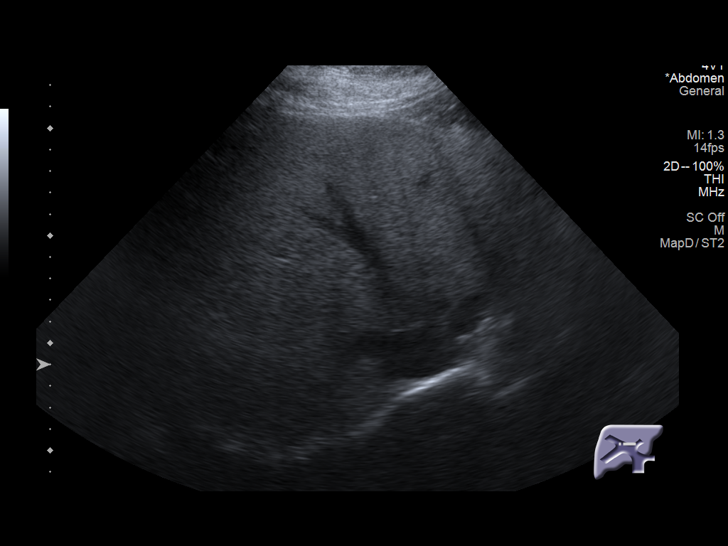
[im 78/78]
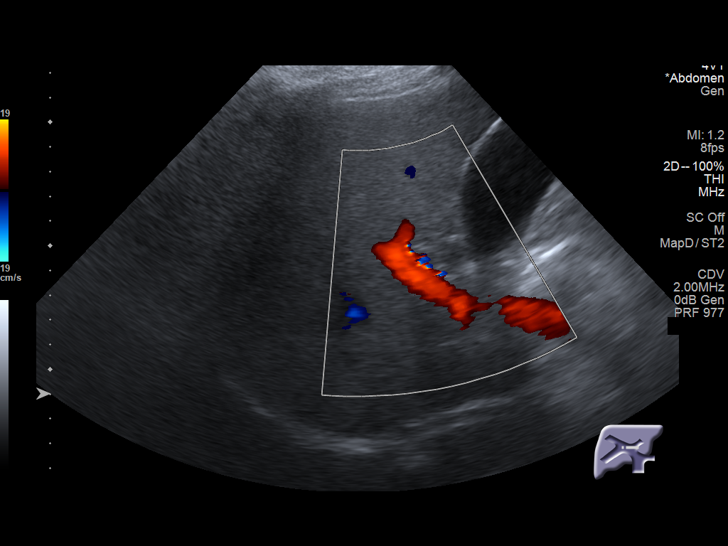

[14 of 25 positions shown; findings below may reference images not displayed]

FINDINGS: Gallbladder:

No gallstones or wall thickening visualized. No sonographic Murphy
sign noted by sonographer.

Common bile duct:

Diameter: 3 mm

Liver:

No focal lesion identified. Increased parenchymal echogenicity.
Portal vein is patent on color Doppler imaging with normal direction
of blood flow towards the liver.

Other: None.
IMPRESSION: Increased liver echogenicity likely reflecting steatosis. Normal
sonographic appearance of gallbladder.

## 2021-01-26 ENCOUNTER — Encounter: Payer: Self-pay | Admitting: Internal Medicine

## 2021-03-09 ENCOUNTER — Encounter: Payer: Self-pay | Admitting: Gastroenterology

## 2021-03-09 ENCOUNTER — Other Ambulatory Visit: Payer: Self-pay

## 2021-03-09 ENCOUNTER — Ambulatory Visit (INDEPENDENT_AMBULATORY_CARE_PROVIDER_SITE_OTHER): Payer: Medicaid Other | Admitting: Gastroenterology

## 2021-03-09 DIAGNOSIS — K625 Hemorrhage of anus and rectum: Secondary | ICD-10-CM | POA: Insufficient documentation

## 2021-03-09 NOTE — Patient Instructions (Signed)
We are arranging a colonoscopy in the near future with Dr. Abbey Chatters.   Further recommendations to follow!  You may be a good candidate for hemorrhoid banding in the future.  Have a good Christmas!  It was a pleasure to see you today. I want to create trusting relationships with patients to provide genuine, compassionate, and quality care. I value your feedback. If you receive a survey regarding your visit,  I greatly appreciate you taking time to fill this out.   Annitta Needs, PhD, ANP-BC [facility]

## 2021-03-09 NOTE — Progress Notes (Signed)
Primary Care Physician:  Julia Shepherd, FNP Referring Physician: Alonza Smoker, FNP Primary Gastroenterologist:  Dr. Gala Rosario  Chief Complaint  Patient presents with   Rectal Bleeding    Had uterine ablation in past. Approx 6 months ago had some bleeding but thought maybe it was vaginal d/t having some spotting prior. Rectal bleeding then started few months ago. Has some rectal pain. Has abdominal pain but has uterine fibroids    HPI:   Julia Rosario is a 40 y.o. female presenting today at the request of Julia Smoker, FNP, due to rectal bleeding. Outside labs from Sept 2022 with Hgb 13.1, Hct 39.6, BUN 14, creatinine 0.88, Tbili 0.4, Alk Phos 38, AST 19, ALT 20.   About 6-7 months ago had spotting and thought was vaginally; however, then she noted it was from her rectum and larger amount. Will have a lot of rectal pressure and feel like she needs to have a BM. Stool has always been "small" since her first child. Ribbon-like at times. Stool is soft. Will have rectal bleeding with wiping. Some seepage at times of blood. Stool will have very small amount of clots at times. History of fibroids and chronic abdominal pain. No diarrhea. Postprandial stool. No family history of colorectal cancer or polyps. Believes maternal grandmother had Crohn's disease.   No unexplained weight loss. When eating, feels bloated. No constipation. No rectal pain, itching, burning.   Past Medical History:  Diagnosis Date   Bipolar 1 disorder ([facility])    Hypertension    Ovarian cyst     Past Surgical History:  Procedure Laterality Date   TUBAL LIGATION     uterine abalation      Current Outpatient Medications  Medication Sig Dispense Refill   cetirizine (ZYRTEC) 10 MG tablet Take 10 mg by mouth daily.     guaiFENesin (MUCINEX) 600 MG 12 hr tablet Take 600 mg by mouth as needed.     lisinopril-hydrochlorothiazide (ZESTORETIC) 20-12.5 MG tablet Take 1 tablet by mouth daily.     omeprazole (PRILOSEC) 20 MG  capsule Take 1 capsule (20 mg total) by mouth 2 (two) times daily before a meal. (Patient taking differently: Take 20 mg by mouth as needed.) 60 capsule 3   Vitamin D, Ergocalciferol, (DRISDOL) 1.25 MG (50000 UNIT) CAPS capsule Take 5,000 Units by mouth once a week.     No current facility-administered medications for this visit.    Allergies as of 03/09/2021 - Review Complete 03/09/2021  Allergen Reaction Noted   Latex Rash 05/12/2011    Family History  Problem Relation Age of Onset   Hypertension Sister    Diabetes Maternal Grandmother    Thyroid disease Maternal Grandmother    Thyroid disease Paternal Grandmother    Colon cancer Neg Hx     Social History   Socioeconomic History   Marital status: Single    Spouse name: Not on file   Number of children: Not on file   Years of education: Not on file   Highest education level: Not on file  Occupational History   Not on file  Tobacco Use   Smoking status: Never   Smokeless tobacco: Never  Substance and Sexual Activity   Alcohol use: No   Drug use: No   Sexual activity: Yes    Birth control/protection: Surgical  Other Topics Concern   Not on file  Social History Narrative   Not on file   Social Determinants of Radio broadcast assistant  Strain: Not on file  Food Insecurity: Not on file  Transportation Needs: Not on file  Physical Activity: Not on file  Stress: Not on file  Social Connections: Not on file  Intimate Partner Violence: Not on file    Review of Systems: Gen: Denies any fever, chills, fatigue, weight loss, lack of appetite.  CV: Denies chest pain, heart palpitations, peripheral edema, syncope.  Resp: Denies shortness of breath at rest or with exertion. Denies wheezing or cough.  GI: see HPI GU : Denies urinary burning, urinary frequency, urinary hesitancy MS: Denies joint pain, muscle weakness, cramps, or limitation of movement.  Derm: Denies rash, itching, dry skin Psych: Denies depression,  anxiety, memory loss, and confusion Heme: Denies bruising, bleeding, and enlarged lymph nodes.  Physical Exam: BP 127/71    Pulse 87    Temp (!) 97.1 F (36.2 C) (Temporal)    Ht _0  (1.727 m)    Wt 297 lb 3.2 oz (134.8 kg)    BMI 45.19 kg/m  General:   Alert and oriented. Pleasant and cooperative. Well-nourished and well-developed.  Head:  Normocephalic and atraumatic. Eyes:  Without icterus, sclera clear and conjunctiva pink.  Ears:  Normal auditory acuity. Mouth:  mask in place Lungs:  Clear to auscultation bilaterally. No wheezes, rales, or rhonchi. No distress.  Heart:  S1, S2 present without murmurs appreciated.  Abdomen:  +BS, soft, non-tender and non-distended. No HSM noted. No guarding or rebound. No masses appreciated.  Rectal:  no external hemorrhoids. DRE without mass. Mild discomfort. Prominence of left lateral and right anterior columns. No anoscopy today.  Msk:  Symmetrical without gross deformities. Normal posture. Extremities:  Without edema. Neurologic:  Alert and  oriented x4;  grossly normal neurologically. Skin:  Intact without significant lesions or rashes. Psych:  Alert and cooperative. Normal mood and affect.  ASSESSMENT: Julia Rosario is a 39 y.o. female presenting today with rectal bleeding for several months and reported smaller caliber of stool. No obvious mass on DRE. No prior colonoscopy or family history of colorectal cancer or polyps. Notes maternal grandmother with history of Crohn's disease.  Will pursue diagnostic colonoscopy expeditiously. May be a good hemorrhoid banding candidate if no other etiology. Due to timing, she will have this done with Dr. Abbey Chatters.    PLAN:  Proceed with colonoscopy by Dr. Abbey Chatters  in near future: the risks, benefits, and alternatives have been discussed with the patient in detail. The patient states understanding and desires to proceed.   Further recommendations to follow.  Annitta Needs, PhD, ANP-BC [facility]  Gastroenterology

## 2021-03-09 NOTE — H&P (View-Only) (Signed)
° ° °Primary Care Physician:  Cobb, Kelly Lynn, FNP °Referring Physician: Kelly Cobb, FNP °Primary Gastroenterologist:  Dr. Rourk ° °Chief Complaint  °Patient presents with  ° Rectal Bleeding  °  Had uterine ablation in past. Approx 6 months ago had some bleeding but thought maybe it was vaginal d/t having some spotting prior. Rectal bleeding then started few months ago. Has some rectal pain. Has abdominal pain but has uterine fibroids  ° ° °HPI:   °Julia Rosario is a 40 y.o. female presenting today at the request of Kelly Cobb, FNP, due to rectal bleeding. Outside labs from Sept 2022 with Hgb 13.1, Hct 39.6, BUN 14, creatinine 0.88, Tbili 0.4, Alk Phos 38, AST 19, ALT 20. ° ° °About 6-7 months ago had spotting and thought was vaginally; however, then she noted it was from her rectum and larger amount. Will have a lot of rectal pressure and feel like she needs to have a BM. Stool has always been "small" since her first child. Ribbon-like at times. Stool is soft. Will have rectal bleeding with wiping. Some seepage at times of blood. Stool will have very small amount of clots at times. History of fibroids and chronic abdominal pain. No diarrhea. Postprandial stool. No family history of colorectal cancer or polyps. Believes maternal grandmother had Crohn's disease.  ° °No unexplained weight loss. When eating, feels bloated. No constipation. No rectal pain, itching, burning.  ° °Past Medical History:  °Diagnosis Date  ° Bipolar 1 disorder ([facility])   ° Hypertension   ° Ovarian cyst   ° ° °Past Surgical History:  °Procedure Laterality Date  ° TUBAL LIGATION    ° uterine abalation    ° ° °Current Outpatient Medications  °Medication Sig Dispense Refill  ° cetirizine (ZYRTEC) 10 MG tablet Take 10 mg by mouth daily.    ° guaiFENesin (MUCINEX) 600 MG 12 hr tablet Take 600 mg by mouth as needed.    ° lisinopril-hydrochlorothiazide (ZESTORETIC) 20-12.5 MG tablet Take 1 tablet by mouth daily.    ° omeprazole (PRILOSEC) 20 MG  capsule Take 1 capsule (20 mg total) by mouth 2 (two) times daily before a meal. (Patient taking differently: Take 20 mg by mouth as needed.) 60 capsule 3  ° Vitamin D, Ergocalciferol, (DRISDOL) 1.25 MG (50000 UNIT) CAPS capsule Take 5,000 Units by mouth once a week.    ° °No current facility-administered medications for this visit.  ° ° °Allergies as of 03/09/2021 - Review Complete 03/09/2021  °Allergen Reaction Noted  ° Latex Rash 05/12/2011  ° ° °Family History  °Problem Relation Age of Onset  ° Hypertension Sister   ° Diabetes Maternal Grandmother   ° Thyroid disease Maternal Grandmother   ° Thyroid disease Paternal Grandmother   ° Colon cancer Neg Hx   ° ° °Social History  ° °Socioeconomic History  ° Marital status: Single  °  Spouse name: Not on file  ° Number of children: Not on file  ° Years of education: Not on file  ° Highest education level: Not on file  °Occupational History  ° Not on file  °Tobacco Use  ° Smoking status: Never  ° Smokeless tobacco: Never  °Substance and Sexual Activity  ° Alcohol use: No  ° Drug use: No  ° Sexual activity: Yes  °  Birth control/protection: Surgical  °Other Topics Concern  ° Not on file  °Social History Narrative  ° Not on file  ° °Social Determinants of Health  ° °Financial Resource   Strain: Not on file  °Food Insecurity: Not on file  °Transportation Needs: Not on file  °Physical Activity: Not on file  °Stress: Not on file  °Social Connections: Not on file  °Intimate Partner Violence: Not on file  ° ° °Review of Systems: °Gen: Denies any fever, chills, fatigue, weight loss, lack of appetite.  °CV: Denies chest pain, heart palpitations, peripheral edema, syncope.  °Resp: Denies shortness of breath at rest or with exertion. Denies wheezing or cough.  °GI: see HPI °GU : Denies urinary burning, urinary frequency, urinary hesitancy °MS: Denies joint pain, muscle weakness, cramps, or limitation of movement.  °Derm: Denies rash, itching, dry skin °Psych: Denies depression,  anxiety, memory loss, and confusion °Heme: Denies bruising, bleeding, and enlarged lymph nodes. ° °Physical Exam: °BP 127/71    Pulse 87    Temp (!) 97.1 °F (36.2 °C) (Temporal)    Ht 5' 8" (1.727 m)    Wt 297 lb 3.2 oz (134.8 kg)    BMI 45.19 kg/m²  °General:   Alert and oriented. Pleasant and cooperative. Well-nourished and well-developed.  °Head:  Normocephalic and atraumatic. °Eyes:  Without icterus, sclera clear and conjunctiva pink.  °Ears:  Normal auditory acuity. °Mouth:  mask in place °Lungs:  Clear to auscultation bilaterally. No wheezes, rales, or rhonchi. No distress.  °Heart:  S1, S2 present without murmurs appreciated.  °Abdomen:  +BS, soft, non-tender and non-distended. No HSM noted. No guarding or rebound. No masses appreciated.  °Rectal:  no external hemorrhoids. DRE without mass. Mild discomfort. Prominence of left lateral and right anterior columns. No anoscopy today.  °Msk:  Symmetrical without gross deformities. Normal posture. °Extremities:  Without edema. °Neurologic:  Alert and  oriented x4;  grossly normal neurologically. °Skin:  Intact without significant lesions or rashes. °Psych:  Alert and cooperative. Normal mood and affect. ° °ASSESSMENT: °Julia Rosario is a 40 y.o. female presenting today with rectal bleeding for several months and reported smaller caliber of stool. No obvious mass on DRE. No prior colonoscopy or family history of colorectal cancer or polyps. Notes maternal grandmother with history of Crohn's disease. ° °Will pursue diagnostic colonoscopy expeditiously. May be a good hemorrhoid banding candidate if no other etiology. Due to timing, she will have this done with Dr. Carver.  ° ° °PLAN: ° °Proceed with colonoscopy by Dr. Carver  in near future: the risks, benefits, and alternatives have been discussed with the patient in detail. The patient states understanding and desires to proceed.  ° °Further recommendations to follow. ° °Kennedey Digilio W. Perl Kerney, PhD, ANP-BC °[facility]  ° °

## 2021-03-10 ENCOUNTER — Telehealth: Payer: Self-pay

## 2021-03-10 NOTE — Telephone Encounter (Signed)
PA request form completed and faxed to [facility] for TCS.

## 2021-03-24 ENCOUNTER — Other Ambulatory Visit: Payer: Self-pay

## 2021-03-24 NOTE — Telephone Encounter (Signed)
No PA needed for TCS if provider in network.

## 2021-03-29 NOTE — Patient Instructions (Signed)
Julia Rosario  03/29/2021     @PREFPERIOPPHARMACY @   Your procedure is scheduled on 04/01/2021.   Report to [facility] at  [facility]   Call this number if you have problems the morning of surgery:  [phone]   Remember:  Follow the diet and prep instructions given to you by the office.    Take these medicines the morning of surgery with A SIP OF WATER                                      zyrtec, prilosec.     Do not wear jewelry, make-up or nail polish.  Do not wear lotions, powders, or perfumes, or deodorant.  Do not shave 48 hours prior to surgery.  Men may shave face and neck.  Do not bring valuables to the hospital.  [facility] is not responsible for any belongings or valuables.  Contacts, dentures or bridgework may not be worn into surgery.  Leave your suitcase in the car.  After surgery it may be brought to your room.  For patients admitted to the hospital, discharge time will be determined by your treatment team.  Patients discharged the day of surgery will not be allowed to drive home and must have someone with them for 24 hours.    Special instructions:   DO NOT smoke tobacco or vape for 24 hours before your procedure.  Please read over the following fact sheets that you were given. Anesthesia Post-op Instructions and Care and Recovery After Surgery      Colonoscopy, Adult, Care After This sheet gives you information about how to care for yourself after your procedure. Your health care provider may also give you more specific instructions. If you have problems or questions, contact your health care provider. What can I expect after the procedure? After the procedure, it is common to have: A small amount of blood in your stool for 24 hours after the procedure. Some gas. Mild cramping or bloating of your abdomen. Follow these instructions at home: Eating and drinking  Drink enough fluid to keep your urine pale yellow. Follow instructions from  your health care provider about eating or drinking restrictions. Resume your normal diet as instructed by your health care provider. Avoid heavy or fried foods that are hard to digest. Activity Rest as told by your health care provider. Avoid sitting for a long time without moving. Get up to take short walks every 1-2 hours. This is important to improve blood flow and breathing. Ask for help if you feel weak or unsteady. Return to your normal activities as told by your health care provider. Ask your health care provider what activities are safe for you. Managing cramping and bloating  Try walking around when you have cramps or feel bloated. Apply heat to your abdomen as told by your health care provider. Use the heat source that your health care provider recommends, such as a moist heat pack or a heating pad. Place a towel between your skin and the heat source. Leave the heat on for 20-30 minutes. Remove the heat if your skin turns bright red. This is especially important if you are unable to feel pain, heat, or cold. You may have a greater risk of getting burned. General instructions If you were given a sedative during the procedure, it can affect you for several hours. Do  not drive or operate machinery until your health care provider says that it is safe. For the first 24 hours after the procedure: Do not sign important documents. Do not drink alcohol. Do your regular daily activities at a slower pace than normal. Eat soft foods that are easy to digest. Take over-the-counter and prescription medicines only as told by your health care provider. Keep all follow-up visits as told by your health care provider. This is important. Contact a health care provider if: You have blood in your stool 2-3 days after the procedure. Get help right away if you have: More than a small spotting of blood in your stool. Large blood clots in your stool. Swelling of your abdomen. Nausea or vomiting. A  fever. Increasing pain in your abdomen that is not relieved with medicine. Summary After the procedure, it is common to have a small amount of blood in your stool. You may also have mild cramping and bloating of your abdomen. If you were given a sedative during the procedure, it can affect you for several hours. Do not drive or operate machinery until your health care provider says that it is safe. Get help right away if you have a lot of blood in your stool, nausea or vomiting, a fever, or increased pain in your abdomen. This information is not intended to replace advice given to you by your health care provider. Make sure you discuss any questions you have with your health care provider. Document Revised: 01/10/2019 Document Reviewed: 09/30/2018 Elsevier Patient Education  [employer] This sheet gives you information about how to care for yourself after your procedure. Your health care provider may also give you more specific instructions. If you have problems or questions, contact your health care provider. What can I expect after the procedure? After the procedure, it is common to have: Tiredness. Forgetfulness about what happened after the procedure. Impaired judgment for important decisions. Nausea or vomiting. Some difficulty with balance. Follow these instructions at home: For the time period you were told by your health care provider:   Rest as needed. Do not participate in activities where you could fall or become injured. Do not drive or use machinery. Do not drink alcohol. Do not take sleeping pills or medicines that cause drowsiness. Do not make important decisions or sign legal documents. Do not take care of children on your own. Eating and drinking Follow the diet that is recommended by your health care provider. Drink enough fluid to keep your urine pale yellow. If you vomit: Drink water, juice, or soup when you can drink  without vomiting. Make sure you have little or no nausea before eating solid foods. General instructions Have a responsible adult stay with you for the time you are told. It is important to have someone help care for you until you are awake and alert. Take over-the-counter and prescription medicines only as told by your health care provider. If you have sleep apnea, surgery and certain medicines can increase your risk for breathing problems. Follow instructions from your health care provider about wearing your sleep device: Anytime you are sleeping, including during daytime naps. While taking prescription pain medicines, sleeping medicines, or medicines that make you drowsy. Avoid smoking. Keep all follow-up visits as told by your health care provider. This is important. Contact a health care provider if: You keep feeling nauseous or you keep vomiting. You feel light-headed. You are still sleepy or having trouble with balance after  24 hours. You develop a rash. You have a fever. You have redness or swelling around the IV site. Get help right away if: You have trouble breathing. You have new-onset confusion at home. Summary For several hours after your procedure, you may feel tired. You may also be forgetful and have poor judgment. Have a responsible adult stay with you for the time you are told. It is important to have someone help care for you until you are awake and alert. Rest as told. Do not drive or operate machinery. Do not drink alcohol or take sleeping pills. Get help right away if you have trouble breathing, or if you suddenly become confused. This information is not intended to replace advice given to you by your health care provider. Make sure you discuss any questions you have with your health care provider. Document Revised: 11/20/2019 Document Reviewed: 02/06/2019 Elsevier Patient Education  2022 [employer].

## 2021-03-30 ENCOUNTER — Encounter (HOSPITAL_COMMUNITY): Payer: Self-pay

## 2021-03-30 ENCOUNTER — Encounter (HOSPITAL_COMMUNITY)
Admission: RE | Admit: 2021-03-30 | Discharge: 2021-03-30 | Disposition: A | Discharge status: Home / Self Care | Payer: Medicaid Other | Source: Ambulatory Visit | Attending: Internal Medicine | Admitting: Internal Medicine

## 2021-03-30 ENCOUNTER — Other Ambulatory Visit: Payer: Self-pay

## 2021-03-30 VITALS — BP 123/71 | HR 82 | Temp 97.1°F | Resp 18 | Ht 68.0 in | Wt 297.2 lb

## 2021-03-30 DIAGNOSIS — Z01818 Encounter for other preprocedural examination: Secondary | ICD-10-CM | POA: Insufficient documentation

## 2021-03-30 DIAGNOSIS — Z79899 Other long term (current) drug therapy: Secondary | ICD-10-CM | POA: Insufficient documentation

## 2021-03-30 LAB — BASIC METABOLIC PANEL
Anion gap: 7 (ref 5–15)
BUN: 22 mg/dL — ABNORMAL HIGH (ref 6–20)
CO2: 24 mmol/L (ref 22–32)
Calcium: 9.2 mg/dL (ref 8.9–10.3)
Chloride: 105 mmol/L (ref 98–111)
Creatinine, Ser: 1.02 mg/dL — ABNORMAL HIGH (ref 0.44–1.00)
GFR, Estimated: >60 mL/min (ref >60)
Glucose, Bld: 99 mg/dL (ref 70–99)
Potassium: 3.9 mmol/L (ref 3.5–5.1)
Sodium: 136 mmol/L (ref 135–145)

## 2021-03-30 LAB — HCG, SERUM, QUALITATIVE: Preg, Serum: NEGATIVE

## 2021-04-01 ENCOUNTER — Ambulatory Visit (HOSPITAL_COMMUNITY)
Admission: RE | Admit: 2021-04-01 | Discharge: 2021-04-01 | Disposition: A | Discharge status: Home / Self Care | Payer: Medicaid Other | Attending: Internal Medicine | Admitting: Internal Medicine

## 2021-04-01 ENCOUNTER — Encounter (HOSPITAL_COMMUNITY): Payer: Self-pay | Admitting: Internal Medicine

## 2021-04-01 ENCOUNTER — Ambulatory Visit (HOSPITAL_COMMUNITY): Payer: Medicaid Other | Admitting: Certified Registered Nurse Anesthetist

## 2021-04-01 ENCOUNTER — Encounter (HOSPITAL_COMMUNITY)
Admission: RE | Disposition: A | Discharge status: Home / Self Care | Payer: Self-pay | Source: Home / Self Care | Attending: Internal Medicine

## 2021-04-01 DIAGNOSIS — D123 Benign neoplasm of transverse colon: Secondary | ICD-10-CM | POA: Insufficient documentation

## 2021-04-01 DIAGNOSIS — K648 Other hemorrhoids: Secondary | ICD-10-CM | POA: Diagnosis not present

## 2021-04-01 DIAGNOSIS — I10 Essential (primary) hypertension: Secondary | ICD-10-CM | POA: Diagnosis not present

## 2021-04-01 DIAGNOSIS — D124 Benign neoplasm of descending colon: Secondary | ICD-10-CM

## 2021-04-01 DIAGNOSIS — K625 Hemorrhage of anus and rectum: Secondary | ICD-10-CM | POA: Diagnosis present

## 2021-04-01 HISTORY — PX: COLONOSCOPY WITH PROPOFOL: SHX5780

## 2021-04-01 HISTORY — PX: POLYPECTOMY: SHX5525

## 2021-04-01 SURGERY — COLONOSCOPY WITH PROPOFOL
Anesthesia: General

## 2021-04-01 MED ORDER — LIDOCAINE HCL (CARDIAC) PF 100 MG/5ML IV SOSY
PREFILLED_SYRINGE | INTRAVENOUS | Status: DC | PRN
  Administered 2021-04-01: 50 mg via INTRAVENOUS

## 2021-04-01 MED ORDER — PROPOFOL 10 MG/ML IV BOLUS
INTRAVENOUS | Status: DC | PRN
Start: 2021-04-01 — End: 2021-04-01
  Administered 2021-04-01 (×5): 50 mg via INTRAVENOUS
  Administered 2021-04-01: 100 mg via INTRAVENOUS

## 2021-04-01 MED ORDER — LACTATED RINGERS IV SOLN: INTRAVENOUS | Status: DC

## 2021-04-01 NOTE — Interval H&P Note (Signed)
History and Physical Interval Note:  04/01/2021 10:47 AM  Julia Rosario  has presented today for surgery, with the diagnosis of rectal bleeding.  The various methods of treatment have been discussed with the patient and family. After consideration of risks, benefits and other options for treatment, the patient has consented to  Procedure(s) with comments: COLONOSCOPY WITH PROPOFOL (N/A) - 11:15am as a surgical intervention.  The patient's history has been reviewed, patient examined, no change in status, stable for surgery.  I have reviewed the patient's chart and labs.  Questions were answered to the patient's satisfaction.     Eloise Harman

## 2021-04-01 NOTE — Anesthesia Preprocedure Evaluation (Signed)
Anesthesia Evaluation  Patient identified by MRN, date of birth, ID band Patient awake    Reviewed: Allergy & Precautions, H&P , NPO status , Patient's Chart, lab work & pertinent test results, reviewed documented beta blocker date and time   Airway Mallampati: II  TM Distance: >3 FB Neck ROM: full    Dental no notable dental hx. (+) Teeth Intact   Pulmonary neg pulmonary ROS,    Pulmonary exam normal breath sounds clear to auscultation       Cardiovascular Exercise Tolerance: Good hypertension, negative cardio ROS   Rhythm:regular Rate:Normal     Neuro/Psych PSYCHIATRIC DISORDERS Bipolar Disorder  Neuromuscular disease    GI/Hepatic negative GI ROS, Neg liver ROS,   Endo/Other  Morbid obesity  Renal/GU negative Renal ROS  negative genitourinary   Musculoskeletal   Abdominal   Peds  Hematology negative hematology ROS (+)   Anesthesia Other Findings   Reproductive/Obstetrics negative OB ROS                             Anesthesia Physical Anesthesia Plan  ASA: 3  Anesthesia Plan: General   Post-op Pain Management:    Induction:   PONV Risk Score and Plan: Propofol infusion  Airway Management Planned:   Additional Equipment:   Intra-op Plan:   Post-operative Plan:   Informed Consent: I have reviewed the patients History and Physical, chart, labs and discussed the procedure including the risks, benefits and alternatives for the proposed anesthesia with the patient or authorized representative who has indicated his/her understanding and acceptance.     Dental Advisory Given  Plan Discussed with: CRNA  Anesthesia Plan Comments:         Anesthesia Quick Evaluation

## 2021-04-01 NOTE — Discharge Instructions (Addendum)
°  Colonoscopy Discharge Instructions  Read the instructions outlined below and refer to this sheet in the next few weeks. These discharge instructions provide you with general information on caring for yourself after you leave the hospital. Your doctor may also give you specific instructions. While your treatment has been planned according to the most current medical practices available, unavoidable complications occasionally occur.   ACTIVITY You may resume your regular activity, but move at a slower pace for the next 24 hours.  Take frequent rest periods for the next 24 hours.  Walking will help get rid of the air and reduce the bloated feeling in your belly (abdomen).  No driving for 24 hours (because of the medicine (anesthesia) used during the test).   Do not sign any important legal documents or operate any machinery for 24 hours (because of the anesthesia used during the test).  NUTRITION Drink plenty of fluids.  You may resume your normal diet as instructed by your doctor.  Begin with a light meal and progress to your normal diet. Heavy or fried foods are harder to digest and may make you feel sick to your stomach (nauseated).  Avoid alcoholic beverages for 24 hours or as instructed.  MEDICATIONS You may resume your normal medications unless your doctor tells you otherwise.  WHAT YOU CAN EXPECT TODAY Some feelings of bloating in the abdomen.  Passage of more gas than usual.  Spotting of blood in your stool or on the toilet paper.  IF YOU HAD POLYPS REMOVED DURING THE COLONOSCOPY: No aspirin products for 7 days or as instructed.  No alcohol for 7 days or as instructed.  Eat a soft diet for the next 24 hours.  FINDING OUT THE RESULTS OF YOUR TEST Not all test results are available during your visit. If your test results are not back during the visit, make an appointment with your caregiver to find out the results. Do not assume everything is normal if you have not heard from your  caregiver or the medical facility. It is important for you to follow up on all of your test results.  SEEK IMMEDIATE MEDICAL ATTENTION IF: You have more than a spotting of blood in your stool.  Your belly is swollen (abdominal distention).  You are nauseated or vomiting.  You have a temperature over 101.  You have abdominal pain or discomfort that is severe or gets worse throughout the day.   Your colonoscopy revealed 1 polyp(s) which I removed successfully. Await pathology results, my office will contact you. I recommend repeating colonoscopy in 5 years for surveillance purposes. You also have internal hemorrhoids, which is likely the cause of your rectal bleeding. I would recommend increasing fiber in your diet or adding OTC Benefiber/Metamucil. Be sure to drink at least 4 to 6 glasses of water daily.   If bleeding continues, we can set you up for hemorrhoid banding in our clinic with [name].  Otherwise follow-up as needed.   I hope you have a great rest of your week!  Elon Alas. Abbey Chatters, D.O. Gastroenterology and Hepatology [facility]

## 2021-04-01 NOTE — Op Note (Signed)
[facility] Patient Name: Julia Rosario Procedure Date: 04/01/2021 10:48 AM MRN: [id number] Date of Birth: 10-May-1980 Attending MD: Elon Alas. Abbey Chatters DO CSN: [id number] Age: 41 Admit Type: Outpatient Procedure:                Colonoscopy Indications:              Rectal bleeding Providers:                Elon Alas. Abbey Chatters, DO, Janeece Riggers, RN, Kristine L.                            Risa Grill, Technician Referring MD:              Medicines:                See the Anesthesia note for documentation of the                            administered medications Complications:            No immediate complications. Estimated Blood Loss:     Estimated blood loss was minimal. Procedure:                Pre-Anesthesia Assessment:                           - The anesthesia plan was to use monitored                            anesthesia care (MAC).                           After obtaining informed consent, the colonoscope                            was passed under direct vision. Throughout the                            procedure, the patient's blood pressure, pulse, and                            oxygen saturations were monitored continuously. The                            [id number]-HQ190L ([id number]) scope was introduced through                            the anus and advanced to the the terminal ileum,                            with identification of the appendiceal orifice and                            IC valve. The colonoscopy was performed without                            difficulty. The patient tolerated the procedure  well. The quality of the bowel preparation was                            evaluated using the BBPS [facility] Bowel Preparation                            Scale) with scores of: Right Colon = 2 (minor                            amount of residual staining, small fragments of                            stool and/or opaque liquid, but mucosa seen  well),                            Transverse Colon = 3 (entire mucosa seen well with                            no residual staining, small fragments of stool or                            opaque liquid) and Left Colon = 3 (entire mucosa                            seen well with no residual staining, small                            fragments of stool or opaque liquid). The total                            BBPS score equals 8. The quality of the bowel                            preparation was good. Scope In: 11:01:33 AM Scope Out: 11:12:59 AM Scope Withdrawal Time: 0 hours 8 minutes 27 seconds  Total Procedure Duration: 0 hours 11 minutes 26 seconds  Findings:      The perianal and digital rectal examinations were normal.      Non-bleeding internal hemorrhoids were found during retroflexion.      A 8 mm polyp was found in the transverse colon. The polyp was flat. The       polyp was removed with a cold snare. Resection and retrieval were       complete.      The exam was otherwise without abnormality.      The terminal ileum appeared normal. Impression:               - Non-bleeding internal hemorrhoids.                           - One 8 mm polyp in the transverse colon, removed                            with a cold snare. Resected and retrieved.                           -  The examination was otherwise normal.                           - The examined portion of the ileum was normal. Moderate Sedation:      Per Anesthesia Care Recommendation:           - Patient has a contact number available for                            emergencies. The signs and symptoms of potential                            delayed complications were discussed with the                            patient. Return to normal activities tomorrow.                            Written discharge instructions were provided to the                            patient.                           - Resume previous diet.                            - Continue present medications.                           - Await pathology results.                           - Repeat colonoscopy in 5 years for surveillance.                           - Return to GI clinic PRN with [name] for                            hemorrhoid banding if bleeding continues. Procedure Code(s):        --- Professional ---                           [phone], Colonoscopy, flexible; with removal of                            tumor(s), polyp(s), or other lesion(s) by snare                            technique Diagnosis Code(s):        --- Professional ---                           K63.5, Polyp of colon                           K64.8, Other  hemorrhoids                           K62.5, Hemorrhage of anus and rectum CPT copyright 2019 American Medical Association. All rights reserved. The codes documented in this report are preliminary and upon coder review may  be revised to meet current compliance requirements. Elon Alas. Abbey Chatters, DO [place] Abbey Chatters, DO 04/01/2021 11:15:58 AM This report has been signed electronically. Number of Addenda: 0

## 2021-04-01 NOTE — Transfer of Care (Signed)
Immediate Anesthesia Transfer of Care Note  Patient: Julia Rosario  Procedure(s) Performed: COLONOSCOPY WITH PROPOFOL POLYPECTOMY  Patient Location: Short Stay  Anesthesia Type:General  Level of Consciousness: awake  Airway & Oxygen Therapy: Patient Spontanous Breathing  Post-op Assessment: Report given to RN and Post -op Vital signs reviewed and stable  Post vital signs: Reviewed and stable  Last Vitals:  Vitals Value Taken Time  BP    Temp    Pulse    Resp    SpO2      Last Pain: There were no vitals filed for this visit.       Complications: No notable events documented.

## 2021-04-01 NOTE — Anesthesia Postprocedure Evaluation (Signed)
Anesthesia Post Note  Patient: Julia Rosario  Procedure(s) Performed: COLONOSCOPY WITH PROPOFOL POLYPECTOMY  Patient location during evaluation: Phase II Anesthesia Type: General Level of consciousness: awake Pain management: pain level controlled Vital Signs Assessment: post-procedure vital signs reviewed and stable Respiratory status: spontaneous breathing and respiratory function stable Cardiovascular status: blood pressure returned to baseline and stable Postop Assessment: no headache and no apparent nausea or vomiting Anesthetic complications: no Comments: Late entry   No notable events documented.   Last Vitals:  Vitals:   04/01/21 1117  BP: 114/76  Pulse: 81  Temp: 37 C  SpO2: 99%    Last Pain:  Vitals:   04/01/21 1117  TempSrc: Oral  PainSc: 0-No pain                 Louann Sjogren

## 2021-04-04 LAB — SURGICAL PATHOLOGY

## 2021-04-08 ENCOUNTER — Encounter (HOSPITAL_COMMUNITY): Payer: Self-pay | Admitting: Internal Medicine

## 2021-08-10 ENCOUNTER — Encounter: Payer: Self-pay | Admitting: Gastroenterology

## 2021-08-10 ENCOUNTER — Ambulatory Visit (INDEPENDENT_AMBULATORY_CARE_PROVIDER_SITE_OTHER): Payer: Medicaid Other | Admitting: Gastroenterology

## 2021-08-10 VITALS — BP 110/78 | HR 78 | Temp 97.2°F | Ht 68.0 in | Wt 300.8 lb

## 2021-08-10 DIAGNOSIS — K641 Second degree hemorrhoids: Secondary | ICD-10-CM | POA: Diagnosis not present

## 2021-08-10 NOTE — Patient Instructions (Signed)
   Please continue to avoid straining.  You should limit your toilet time to 2-3 minutes at the most.   Continue to avoid constipation.  Please call me with any concerns or issues!  I will see you in follow-up for additional banding in several weeks.

## 2021-08-10 NOTE — Progress Notes (Addendum)
     [place] BANDING PROCEDURE NOTE  Julia Rosario is a 41 y.o. female presenting today for consideration of hemorrhoid banding. Last colonoscopy Jan 2023 with non-bleeding internal hemorrhoids and one 8 mm tubular adenoma. She has bleeding, pressure, itching, burning.    The patient presents with symptomatic grade 2 hemorrhoids, unresponsive to maximal medical therapy, requesting rubber band ligation of her hemorrhoidal disease. All risks, benefits, and alternative forms of therapy were described and informed consent was obtained.  In the left lateral decubitus position, anoscopic examination revealed grade 2 hemorrhoids in the left lateral and right anterior position (s).  The decision was made to band the left internal hemorrhoid with latex-free band, and the [place] was used to perform band ligation without complication. Digital anorectal examination was then performed to assure proper positioning of the band, and to adjust the banded tissue as required. The patient was discharged home without pain or other issues. Dietary and behavioral recommendations were given, along with follow-up instructions. The patient will return in several weeks for followup and possible additional banding as required.  No complications were encountered and the patient tolerated the procedure well.   Annitta Needs, PhD, ANP-BC [facility]

## 2021-08-25 ENCOUNTER — Encounter: Payer: Self-pay | Admitting: Gastroenterology

## 2021-08-25 ENCOUNTER — Ambulatory Visit (INDEPENDENT_AMBULATORY_CARE_PROVIDER_SITE_OTHER): Payer: Medicaid Other | Admitting: Gastroenterology

## 2021-08-25 VITALS — BP 110/78 | HR 88 | Temp 97.7°F | Ht 68.0 in | Wt 303.8 lb

## 2021-08-25 DIAGNOSIS — K641 Second degree hemorrhoids: Secondary | ICD-10-CM

## 2021-08-25 NOTE — Progress Notes (Signed)
    [place] BANDING PROCEDURE NOTE  Julia KENYANNA GRZESIAK is a 41 y.o. female presenting today for consideration of hemorrhoid banding. Last colonoscopy Jan 2023 with non-bleeding internal hemorrhoids and one 8 mm tubular adenoma. She has bleeding, pressure, itching, burning. She has had left lateral banding thus far. Some improvement.   Julia Rosario presents with symptomatic grade 2 hemorrhoids, unresponsive to maximal medical therapy, requesting rubber band ligation of his/her hemorrhoidal disease. All risks, benefits, and alternative forms of therapy were described and informed consent was obtained.  Julia decision was made to band Julia right posterior internal hemorrhoid using latex-free bands, and Julia Kinder was used to perform band ligation without complication. Digital anorectal examination was then performed to assure proper positioning of Julia band, and to adjust Julia banded tissue as required. Julia Rosario was discharged home without pain or other issues. Dietary and behavioral recommendations were given, along with follow-up instructions. Julia Rosario will return in several weeks for followup and possible additional banding as required.  No complications were encountered and Julia Rosario tolerated Julia procedure well.   Annitta Needs, PhD, ANP-BC [facility]

## 2021-08-25 NOTE — Patient Instructions (Signed)
Please continue to avoid straining.  You should limit your toilet time to 2-3 minutes at the most.   Continue to avoid constipation.  Please call me with any concerns or issues!  I will see you in follow-up for additional banding in several weeks.    I enjoyed seeing you again today! As you know, I value our relationship and want to provide genuine, compassionate, and quality care. I welcome your feedback. If you receive a survey regarding your visit,  I greatly appreciate you taking time to fill this out. See you next time!  Meeghan Skipper W. Shafter Jupin, PhD, ANP-BC [facility]       

## 2021-09-08 ENCOUNTER — Ambulatory Visit (INDEPENDENT_AMBULATORY_CARE_PROVIDER_SITE_OTHER): Payer: Medicaid Other | Admitting: Gastroenterology

## 2021-09-08 ENCOUNTER — Encounter: Payer: Self-pay | Admitting: Gastroenterology

## 2021-09-08 VITALS — BP 114/70 | HR 85 | Temp 97.3°F | Ht 68.0 in | Wt 301.6 lb

## 2021-09-08 DIAGNOSIS — K641 Second degree hemorrhoids: Secondary | ICD-10-CM | POA: Diagnosis not present

## 2021-09-08 NOTE — Patient Instructions (Signed)
Please continue to avoid straining.  You should limit your toilet time to 2-3 minutes at the most.   Continue to avoid constipation.  Please call me with any concerns or issues!  We will see you back as needed!  I enjoyed seeing you again today! As you know, I value our relationship and want to provide genuine, compassionate, and quality care. I welcome your feedback. If you receive a survey regarding your visit,  I greatly appreciate you taking time to fill this out. See you next time!  Janesia Joswick W. Seamus Warehime, PhD, ANP-BC [facility]           

## 2021-09-08 NOTE — Progress Notes (Cosign Needed)
    [place] BANDING PROCEDURE NOTE  Julia Rosario is a 41 y.o. female presenting today for consideration of hemorrhoid banding. Last colonoscopy Jan 2023 with non-bleeding internal hemorrhoids and one 8 mm tubular adenoma. Bleeding has resolved. She still has pressure, itching, burning. Left lateral and right posterior have been banded.   The patient presents with symptomatic grade 2 hemorrhoids, unresponsive to maximal medical therapy, requesting rubber band ligation of her hemorrhoidal disease. All risks, benefits, and alternative forms of therapy were described and informed consent was obtained.  The decision was made to band the right anterior internal hemorrhoid using latex-free bands, and the [place] was used to perform band ligation without complication. Digital anorectal examination was then performed to assure proper positioning of the band, and to adjust the banded tissue as required. The patient was discharged home without pain or other issues. Dietary and behavioral recommendations were given, along with follow-up instructions. The patient will return in follow-up as needed.   No complications were encountered and the patient tolerated the procedure well.   Annitta Needs, PhD, ANP-BC [facility]

## 2022-10-23 ENCOUNTER — Emergency Department (HOSPITAL_COMMUNITY)
Admission: EM | Admit: 2022-10-23 | Discharge: 2022-10-23 | Discharge status: Against Medical Advice | Payer: Medicaid Other | Attending: Emergency Medicine | Admitting: Emergency Medicine

## 2022-10-23 ENCOUNTER — Other Ambulatory Visit: Payer: Self-pay

## 2022-10-23 DIAGNOSIS — X58XXXA Exposure to other specified factors, initial encounter: Secondary | ICD-10-CM | POA: Insufficient documentation

## 2022-10-23 DIAGNOSIS — Z5321 Procedure and treatment not carried out due to patient leaving prior to being seen by health care provider: Secondary | ICD-10-CM | POA: Insufficient documentation

## 2022-10-23 DIAGNOSIS — S91311A Laceration without foreign body, right foot, initial encounter: Secondary | ICD-10-CM | POA: Insufficient documentation

## 2022-10-23 NOTE — ED Triage Notes (Signed)
Laceration to back of right heel

## 2023-09-21 ENCOUNTER — Ambulatory Visit: Admission: EM | Admit: 2023-09-21 | Discharge: 2023-09-21 | Disposition: A | Discharge status: Home / Self Care

## 2023-09-21 DIAGNOSIS — L03213 Periorbital cellulitis: Secondary | ICD-10-CM | POA: Diagnosis not present

## 2023-09-21 MED ORDER — SULFAMETHOXAZOLE-TRIMETHOPRIM 800-160 MG PO TABS: 1.0000 | ORAL_TABLET | Freq: Two times a day (BID) | ORAL | 0 refills | Status: DC

## 2023-09-21 NOTE — Discharge Instructions (Signed)
 Take medication as prescribed. Continue cool compresses to the right eye to help with pain or swelling.  May also use warm compresses to help with pain or discomfort. Recommend over-the-counter Visine or Clear Eyes eyedrops to help keep these eye moist and lubricated. Strict handwashing when applying medication.  Avoid rubbing or manipulating the eyes while symptoms persist. Discard any eye make-up that were using prior to your symptoms starting. As discussed, if you experience increased redness, swelling, or worsening symptoms, please go to the emergency department immediately for further evaluation. Follow-up as needed.

## 2023-09-21 NOTE — ED Provider Notes (Signed)
 [facility]    CSN: [id number] Arrival date & time: 09/21/23  0856      History   Chief Complaint No chief complaint on file.   HPI Julia Rosario is a 43 y.o. female.   The history is provided by the patient.   Patient presents for complaints of redness, swelling, and irritation of the right eye.  Patient states approximately 2 days ago she was dying her son's hair.  She states that she was also having symptoms of allergic rhinitis.  She states that while she was dying her son's hair, she did rub under her eye, but was pretty sure that she did not get the dye in her eye.  She denies visual changes, fever, chills, light sensitivity, or feeling of a foreign object in the eye.  Patient does wear glasses.  Past Medical History:  Diagnosis Date   Bipolar 1 disorder (HCC)    Hypertension    Ovarian cyst     Patient Active Problem List   Diagnosis Date Noted   Prolapsed internal hemorrhoids, grade 2 08/10/2021   Rectal bleeding 03/09/2021   Abdominal pain, epigastric 04/14/2019   Dysphagia 04/14/2019   Alternating constipation and diarrhea 04/14/2019   Strain of abdominal muscle 12/10/2013   Pelvic pain in female 03/03/2013   Bipolar disorder in remission (HCC) 10/16/2012    Past Surgical History:  Procedure Laterality Date   COLONOSCOPY WITH PROPOFOL  N/A 04/01/2021   Procedure: COLONOSCOPY WITH PROPOFOL ;  Surgeon: Cindie Carlin POUR, DO;  Location: [facility];  Service: Endoscopy;  Laterality: N/A;  11:15am   POLYPECTOMY  04/01/2021   Procedure: POLYPECTOMY;  Surgeon: Cindie Carlin POUR, DO;  Location: [facility];  Service: Endoscopy;;   TUBAL LIGATION     uterine abalation      OB History   No obstetric history on file.      Home Medications    Prior to Admission medications   Medication Sig Start Date End Date Taking? Authorizing Provider  sulfamethoxazole -trimethoprim  (BACTRIM  DS) 800-160 MG tablet Take 1 tablet by mouth 2 (two) times daily for  7 days. 09/21/23 09/28/23 Yes Leath-Warren, Etta PARAS, NP  acetaminophen  (TYLENOL ) 500 MG tablet Take 1,000 mg by mouth every 6 (six) hours as needed for mild pain.    [provider]  cetirizine (ZYRTEC) 10 MG tablet Take 10 mg by mouth daily.    [provider]  lisinopril-hydrochlorothiazide (ZESTORETIC) 20-12.5 MG tablet Take 1 tablet by mouth daily. 03/01/21   [provider]  omeprazole  (PRILOSEC) 20 MG capsule Take 1 capsule (20 mg total) by mouth 2 (two) times daily before a meal. Patient taking differently: Take 20 mg by mouth daily as needed (indigestion). 04/21/19   Rudy Josette RAMAN, PA-C  venlafaxine XR (EFFEXOR XR) 75 MG 24 hr capsule     [provider]  Vitamin D, Ergocalciferol, (DRISDOL) 1.25 MG (50000 UNIT) CAPS capsule Take 5,000 Units by mouth once a week. Saturday 07/11/19   [provider]    Family History Family History  Problem Relation Age of Onset   Hypertension Sister    Diabetes Maternal Grandmother    Thyroid disease Maternal Grandmother    Crohn's disease Maternal Grandmother    Thyroid disease Paternal Grandmother    Colon cancer Neg Hx    Colon polyps Neg Hx     Social History Social History   Tobacco Use   Smoking status: Never   Smokeless tobacco: Never  Substance Use  Topics   Alcohol use: No   Drug use: No     Allergies   Onion and Latex   Review of Systems Review of Systems Per HPI  Physical Exam Triage Vital Signs ED Triage Vitals [09/21/23 0907]  Encounter Vitals Group     BP (!) 129/91     Girls Systolic BP Percentile      Girls Diastolic BP Percentile      Boys Systolic BP Percentile      Boys Diastolic BP Percentile      Pulse Rate 68     Resp 18     Temp 97.7 F (36.5 C)     Temp Source Oral     SpO2 95 %     Weight      Height      Head Circumference      Peak Flow      Pain Score 6     Pain Loc      Pain Education      Exclude from Growth Chart    No data  found.  Updated Vital Signs BP (!) 129/91 (BP Location: Right Arm)   Pulse 68   Temp 97.7 F (36.5 C) (Oral)   Resp 18   LMP  (LMP Unknown)   SpO2 95%   Visual Acuity Right Eye Distance: 20/25 Left Eye Distance: 20/25 Bilateral Distance: 20/20  Right Eye Near:   Left Eye Near:    Bilateral Near:     Physical Exam Vitals and nursing note reviewed.  Constitutional:      General: She is not in acute distress.    Appearance: Normal appearance.  HENT:     Head: Normocephalic.  Eyes:     General: Vision grossly intact. No visual field deficit.       Right eye: Discharge (clear, tearing) present. No foreign body.     Extraocular Movements: Extraocular movements intact.     Right eye: Abnormal extraocular motion and nystagmus present.     Conjunctiva/sclera:     Right eye: Right conjunctiva is injected. No chemosis, exudate or hemorrhage.    Pupils: Pupils are equal, round, and reactive to light.     Comments: Swelling noted to the lower right eyelid that extends down towards the cheek.  The area is erythematous and tender to palpation.  Right conjunctiva with injection.  There is no exudate, hemorrhage, or hordeolum present.  Pulmonary:     Effort: Pulmonary effort is normal.  Musculoskeletal:     Cervical back: Normal range of motion.  Skin:    General: Skin is warm and dry.  Neurological:     General: No focal deficit present.     Mental Status: She is alert and oriented to person, place, and time.  Psychiatric:        Mood and Affect: Mood normal.        Behavior: Behavior normal.      UC Treatments / Results  Labs (all labs ordered are listed, but only abnormal results are displayed) Labs Reviewed - No data to display  EKG   Radiology No results found.  Procedures Procedures (including critical care time)  Medications Ordered in UC Medications - No data to display  Initial Impression / Assessment and Plan / UC Course  I have reviewed the triage vital  signs and the nursing notes.  Pertinent labs & imaging results that were available during my care of the patient were reviewed by me and considered in  my medical decision making (see chart for details).  Patient was swelling under the right lower eyelid, symptoms consistent with preseptal/preorbital cellulitis.  Will treat with Bactrim  DS 800/160 mg.  Supportive care recommendations were provided and discussed with the patient to include over-the-counter analgesics, applying warm or cool compresses, and to monitor for signs of infection.  Discussed indications with patient regarding follow-up.  Patient was also given strict ER follow-up precautions.  Patient was in agreement with this plan of care and verbalizes understanding.  All questions were answered.  Patient stable for discharge.  Final Clinical Impressions(s) / UC Diagnoses   Final diagnoses:  Preseptal cellulitis of right lower eyelid     Discharge Instructions      Take medication as prescribed. Continue cool compresses to the right eye to help with pain or swelling.  May also use warm compresses to help with pain or discomfort. Recommend over-the-counter Visine or Clear Eyes eyedrops to help keep these eye moist and lubricated. Strict handwashing when applying medication.  Avoid rubbing or manipulating the eyes while symptoms persist. Discard any eye make-up that were using prior to your symptoms starting. As discussed, if you experience increased redness, swelling, or worsening symptoms, please go to the emergency department immediately for further evaluation. Follow-up as needed.      ED Prescriptions     Medication Sig Dispense Auth. Provider   sulfamethoxazole -trimethoprim  (BACTRIM  DS) 800-160 MG tablet Take 1 tablet by mouth 2 (two) times daily for 7 days. 14 tablet Leath-Warren, Etta PARAS, NP      PDMP not reviewed this encounter.   [place] Etta PARAS, NP 09/21/23 1027

## 2023-09-21 NOTE — ED Triage Notes (Signed)
 Pt reports she believes some hair dye has gotten in her right eye x 2 days Pt right eye is swollen, scratchy, and watery

## 2023-09-26 ENCOUNTER — Other Ambulatory Visit: Payer: Self-pay

## 2023-09-26 ENCOUNTER — Emergency Department (HOSPITAL_COMMUNITY)
Admission: EM | Admit: 2023-09-26 | Discharge: 2023-09-26 | Disposition: A | Discharge status: Home / Self Care | Attending: Emergency Medicine | Admitting: Emergency Medicine

## 2023-09-26 ENCOUNTER — Encounter (HOSPITAL_COMMUNITY): Payer: Self-pay | Admitting: *Deleted

## 2023-09-26 DIAGNOSIS — H1031 Unspecified acute conjunctivitis, right eye: Secondary | ICD-10-CM

## 2023-09-26 DIAGNOSIS — I10 Essential (primary) hypertension: Secondary | ICD-10-CM | POA: Diagnosis not present

## 2023-09-26 DIAGNOSIS — H5789 Other specified disorders of eye and adnexa: Secondary | ICD-10-CM | POA: Insufficient documentation

## 2023-09-26 DIAGNOSIS — Z79899 Other long term (current) drug therapy: Secondary | ICD-10-CM | POA: Diagnosis not present

## 2023-09-26 DIAGNOSIS — Z91040 Latex allergy status: Secondary | ICD-10-CM | POA: Diagnosis not present

## 2023-09-26 DIAGNOSIS — R220 Localized swelling, mass and lump, head: Secondary | ICD-10-CM | POA: Insufficient documentation

## 2023-09-26 MED ORDER — TETRACAINE HCL 0.5 % OP SOLN
1.0000 [drp] | Freq: Once | OPHTHALMIC | Status: AC
  Administered 2023-09-26: 1 [drp] via OPHTHALMIC
  Filled 2023-09-26: qty 4

## 2023-09-26 MED ORDER — FLUORESCEIN SODIUM 1 MG OP STRP
1.0000 | ORAL_STRIP | Freq: Once | OPHTHALMIC | Status: AC
Start: 2023-09-26 — End: 2023-09-26
  Administered 2023-09-26: 1 via OPHTHALMIC
  Filled 2023-09-26: qty 1

## 2023-09-26 NOTE — Discharge Instructions (Signed)
 You are seen in the ER today for eye redness and swelling.  Your exam was overall reassuring, you do have quite a bit of irritation.  I spoke with the on-call eye doctor, he recommended preservative-free artificial tears which she can get over-the-counter, and he will see you at 8:00 tomorrow morning.  If your schedule changes and you get there by 4 PM today he could also see you then.

## 2023-09-26 NOTE — ED Provider Notes (Signed)
 [place] EMERGENCY DEPARTMENT AT [facility] Provider Note   CSN: [id number] Arrival date & time: 09/26/23  9055     Patient presents with: Facial Swelling   Julia Rosario is a 43 y.o. female.  PMH of bipolar disorder, hypertension.  Presents the ER today for right eye redness swelling irritation.  She has started about 5 days ago after using hair dye on her son, she thinks she gets blood in her eye.  She had some swelling of her lower eyelid, so urgent care prescribed Bactrim  for possible infection and told her to use allergy eyedrops.  She states that she has been using these and woke up this morning with some worsened swelling and redness.  She reports watery drainage from the eye.  She denies vision changes.  No fever or chills, no pain with extraocular movements.   HPI     Prior to Admission medications   Medication Sig Start Date End Date Taking? Authorizing Provider  acetaminophen  (TYLENOL ) 500 MG tablet Take 1,000 mg by mouth every 6 (six) hours as needed for mild pain.    [provider]  cetirizine (ZYRTEC) 10 MG tablet Take 10 mg by mouth daily.    [provider]  lisinopril-hydrochlorothiazide (ZESTORETIC) 20-12.5 MG tablet Take 1 tablet by mouth daily. 03/01/21   [provider]  omeprazole  (PRILOSEC) 20 MG capsule Take 1 capsule (20 mg total) by mouth 2 (two) times daily before a meal. Patient taking differently: Take 20 mg by mouth daily as needed (indigestion). 04/21/19   Rudy Josette RAMAN, PA-C  sulfamethoxazole -trimethoprim  (BACTRIM  DS) 800-160 MG tablet Take 1 tablet by mouth 2 (two) times daily for 7 days. 09/21/23 09/28/23  Leath-Warren, Etta PARAS, NP  venlafaxine XR (EFFEXOR XR) 75 MG 24 hr capsule     [provider]  Vitamin D, Ergocalciferol, (DRISDOL) 1.25 MG (50000 UNIT) CAPS capsule Take 5,000 Units by mouth once a week. Saturday 07/11/19   [provider]    Allergies: Onion and Latex    Review of  Systems  Updated Vital Signs BP 128/70 (BP Location: Right Arm)   Pulse 92   Temp 99 F (37.2 C) (Oral)   Resp 17   Ht 5' 8 (1.727 m)   Wt 128.4 kg   LMP  (LMP Unknown)   SpO2 97%   BMI 43.03 kg/m   Physical Exam Vitals and nursing note reviewed.  Constitutional:      General: She is not in acute distress.    Appearance: She is well-developed.  HENT:     Head: Normocephalic and atraumatic.  Eyes:     Conjunctiva/sclera: Conjunctivae normal.  Cardiovascular:     Rate and Rhythm: Normal rate and regular rhythm.     Heart sounds: No murmur heard. Pulmonary:     Effort: Pulmonary effort is normal. No respiratory distress.     Breath sounds: Normal breath sounds.  Abdominal:     Palpations: Abdomen is soft.     Tenderness: There is no abdominal tenderness.  Musculoskeletal:        General: No swelling.     Cervical back: Neck supple.  Skin:    General: Skin is warm and dry.     Capillary Refill: Capillary refill takes less than 2 seconds.  Neurological:     General: No focal deficit present.     Mental Status: She is alert and oriented to person, place, and time.  Psychiatric:  Mood and Affect: Mood normal.        (all labs ordered are listed, but only abnormal results are displayed) Labs Reviewed - No data to display  EKG: None  Radiology: No results found.   Procedures   Medications Ordered in the ED - No data to display                                  Medical Decision Making Differential Diagnosis includes but not limited to viral conjunctivitis, chemical conjunctivitis, keratitis, periorbital cellulitis, corneal abrasion, other  Consults: Discussed with Dr. Octavia, on-call for ophthalmology-he can see patient in the office either today before 4 PM or at 8 AM tomorrow if patient cannot make it today, in the meantime only preservative-free artificial tears  ED course: Patient presents ER ration of right eye redness with clear tearing of the  eye, has been on Bactrim  with mild improvement but still having irritation and redness.  This is a setting of having gotten some hair dye in her eye.  Discussed with ophthalmology as above.  Patient states she cannot get to the eye doctor today but can go tomorrow morning.  She is advised to get there at 8 AM.  She was informed of the use of preservative-free artificial tears and strict return precautions.  Risk Prescription drug management.        Final diagnoses:  None    ED Discharge Orders     None          Suellen Sherran DELENA DEVONNA 09/26/23 1304    Suzette Pac, MD 09/29/23 1007

## 2023-09-26 NOTE — ED Triage Notes (Signed)
 Pt c/o pain and swelling to right eye; pt was seen at urgent care x 5 days ago and was given bactrim  and told to use allergy drops  Pt has significant swelling to right eye; pt denies any visual changes

## 2023-09-27 ENCOUNTER — Other Ambulatory Visit: Payer: Self-pay

## 2023-09-27 ENCOUNTER — Emergency Department (HOSPITAL_COMMUNITY)
Admission: EM | Admit: 2023-09-27 | Discharge: 2023-09-27 | Disposition: A | Discharge status: Home / Self Care | Source: Ambulatory Visit | Attending: Emergency Medicine | Admitting: Emergency Medicine

## 2023-09-27 ENCOUNTER — Encounter (HOSPITAL_COMMUNITY): Payer: Self-pay

## 2023-09-27 ENCOUNTER — Emergency Department (HOSPITAL_COMMUNITY)

## 2023-09-27 DIAGNOSIS — Z91040 Latex allergy status: Secondary | ICD-10-CM | POA: Diagnosis not present

## 2023-09-27 DIAGNOSIS — I10 Essential (primary) hypertension: Secondary | ICD-10-CM | POA: Diagnosis not present

## 2023-09-27 DIAGNOSIS — H0289 Other specified disorders of eyelid: Secondary | ICD-10-CM | POA: Diagnosis present

## 2023-09-27 DIAGNOSIS — Z79899 Other long term (current) drug therapy: Secondary | ICD-10-CM | POA: Insufficient documentation

## 2023-09-27 DIAGNOSIS — L03213 Periorbital cellulitis: Secondary | ICD-10-CM | POA: Diagnosis not present

## 2023-09-27 MED ORDER — CLINDAMYCIN HCL 150 MG PO CAPS: 300.0000 mg | ORAL_CAPSULE | Freq: Four times a day (QID) | ORAL | 0 refills | Status: AC

## 2023-09-27 MED ORDER — IOHEXOL 300 MG/ML  SOLN
75.0000 mL | Freq: Once | INTRAMUSCULAR | Status: AC | PRN
  Administered 2023-09-27: 75 mL via INTRAVENOUS

## 2023-09-27 MED ORDER — ERYTHROMYCIN 5 MG/GM OP OINT
TOPICAL_OINTMENT | Freq: Once | OPHTHALMIC | Status: AC
  Filled 2023-09-27: qty 3.5

## 2023-09-27 NOTE — ED Triage Notes (Signed)
 Pt arrived via POV c/o right eye swelling. Pt seen here and at urgent care yesterday for same complaint. Pt returned today due to new onset of a rash to her neck and chest and reports a doctor from [facility] advised her to return to the ER for possible CT Scan.

## 2023-09-27 NOTE — ED Provider Notes (Signed)
 [place] EMERGENCY DEPARTMENT AT [facility] Provider Note   CSN: [id number] Arrival date & time: 09/27/23  1405     Patient presents with: Conjunctivitis   Julia Rosario is a 43 y.o. female.    Conjunctivitis   This patient is a 43 year old female, she has a history of hypertension on hydrochlorothiazide with lisinopril, she is on Effexor and recently developed some right sided eyelid swelling after trying to use a hair dye foam on her child, she felt like some of this got into her eye and shortly thereafter she developed swelling of both the upper and lower lids of the right side with mild watering of the eye but no changes in vision.  She was placed on Bactrim  by the urgent care which she took for about 4 days, it did not seem to be helping very much, yesterday was seen in the emergency department and was told to go to ophthalmology, she saw the eye specialist today and was told that it was probably pinkeye, she then went to a family doctor at [facility] in [place] at a family clinic who told her that she needed to have a CT scan of the eye to make sure there was no abscess behind the eye as she has been having some pain on the right posterior aspect of the occiput.  She has no other complaints including no fevers no vomiting no diarrhea no coughing no shortness of breath, no itching.  After getting her first dose of doxycycline at that same clinic today she developed a diffuse rash across her upper chest and starting to get up onto her neck.  It is mildly tender to the touch, it is not itchy, she has no difficulty breathing.    Prior to Admission medications   Medication Sig Start Date End Date Taking? Authorizing Provider  clindamycin  (CLEOCIN ) 150 MG capsule Take 2 capsules (300 mg total) by mouth 4 (four) times daily for 10 days. May dispense as 150mg  capsules 09/27/23 10/07/23 Yes Cleotilde Rogue, MD  acetaminophen  (TYLENOL ) 500 MG tablet Take 1,000 mg by mouth every 6 (six)  hours as needed for mild pain.    [provider]  cetirizine (ZYRTEC) 10 MG tablet Take 10 mg by mouth daily.    [provider]  lisinopril-hydrochlorothiazide (ZESTORETIC) 20-12.5 MG tablet Take 1 tablet by mouth daily. 03/01/21   [provider]  omeprazole  (PRILOSEC) 20 MG capsule Take 1 capsule (20 mg total) by mouth 2 (two) times daily before a meal. Patient taking differently: Take 20 mg by mouth daily as needed (indigestion). 04/21/19   Rudy Josette RAMAN, PA-C  venlafaxine XR (EFFEXOR XR) 75 MG 24 hr capsule     [provider]  Vitamin D, Ergocalciferol, (DRISDOL) 1.25 MG (50000 UNIT) CAPS capsule Take 5,000 Units by mouth once a week. Saturday 07/11/19   [provider]    Allergies: Onion and Latex    Review of Systems  All other systems reviewed and are negative.   Updated Vital Signs BP 111/68 (BP Location: Right Arm)   Pulse 74   Temp 98.1 F (36.7 C)   Resp 15   Ht 1.727 m (5' 8)   Wt 128.3 kg   LMP  (LMP Unknown)   SpO2 95%   BMI 43.01 kg/m   Physical Exam Vitals and nursing note reviewed.  Constitutional:      General: She is not in acute distress.    Appearance: She is well-developed.  HENT:  Head: Normocephalic and atraumatic.     Ears:     Comments: No tenderness with manipulation of the auricle, no periauricular lymphadenopathy    Nose: Nose normal.     Mouth/Throat:     Pharynx: No oropharyngeal exudate.     Comments: Clear mucous membranes Eyes:     General: No scleral icterus.       Right eye: No discharge.        Left eye: No discharge.     Pupils: Pupils are equal, round, and reactive to light.     Comments: Minimal swelling of the right-sided upper and lower lids, conjunctiva is mildly erythematous, gross visual acuity is totally normal, extraocular movements are normal without any significant tenderness.  There is no tenderness over the temporal artery, there is tenderness in the right posterior  occiput, there is no lymphadenopathy of the neck  Neck:     Thyroid: No thyromegaly.     Vascular: No JVD.  Cardiovascular:     Rate and Rhythm: Normal rate and regular rhythm.     Heart sounds: Normal heart sounds. No murmur heard.    No friction rub. No gallop.  Pulmonary:     Effort: Pulmonary effort is normal. No respiratory distress.     Breath sounds: Normal breath sounds. No wheezing or rales.  Abdominal:     General: Bowel sounds are normal. There is no distension.     Palpations: Abdomen is soft. There is no mass.     Tenderness: There is no abdominal tenderness.  Musculoskeletal:        General: No tenderness. Normal range of motion.     Cervical back: Normal range of motion and neck supple.  Lymphadenopathy:     Cervical: No cervical adenopathy.  Skin:    General: Skin is warm and dry.     Findings: Rash present. No erythema.     Comments: Confluent erythematous rash across the upper torso, upper chest and onto the neck, and involves the upper back near the shoulders but not the lower back, it does not involve the extremities palms or soles  Neurological:     Mental Status: She is alert.     Coordination: Coordination normal.  Psychiatric:        Behavior: Behavior normal.     (all labs ordered are listed, but only abnormal results are displayed) Labs Reviewed - No data to display  EKG: None  Radiology: CT Orbits W Contrast Result Date: 09/27/2023 CLINICAL DATA:  Orbital trauma, eyelid swelling on the right. EXAM: CT ORBITS WITH CONTRAST TECHNIQUE: Multidetector CT images was performed according to the standard protocol following intravenous contrast administration. RADIATION DOSE REDUCTION: This exam was performed according to the departmental dose-optimization program which includes automated exposure control, adjustment of the mA and/or kV according to patient size and/or use of iterative reconstruction technique. CONTRAST:  75mL OMNIPAQUE  IOHEXOL  300 MG/ML  SOLN  COMPARISON:  None Available. FINDINGS: Orbits: The globes are intact. Lenses are normally located. The extraocular muscles and optic nerve sheath complexes are unremarkable. Normal appearance of the intraorbital fat. No evidence of orbital mass or acute inflammation. There is preseptal soft tissue swelling of the right orbit with swelling of the upper and lower eyelids. Additional mild stranding within the subcutaneous tissues over the right supraorbital ridge. Additional subtle stranding in the subcutaneous tissues just inferior to the right orbit. Visible paranasal sinuses: Mucosal thickening and mucous retention cyst in the right maxillary sinus. Otherwise no significant  mucosal thickening. No air-fluid levels. Soft tissues: Right periorbital soft tissue swelling as above. The soft tissues are otherwise unremarkable. Osseous: No acute or aggressive lesion.  Bony orbits are intact. Limited intracranial: Limited visualization of intracranial structures without focal acute abnormality. IMPRESSION: Preseptal soft tissue swelling of the right orbit concerning for preseptal cellulitis. Additional subtle periorbital soft tissue swelling. No findings to suggest orbital cellulitis or abscess. Electronically Signed   By: Donnice Mania M.D.   On: 09/27/2023 18:09     Procedures   Medications Ordered in the ED  erythromycin  ophthalmic ointment ( Right Eye Given 09/27/23 1639)  iohexol  (OMNIPAQUE ) 300 MG/ML solution 75 mL (75 mLs Intravenous Contrast Given 09/27/23 1703)                                    Medical Decision Making Amount and/or Complexity of Data Reviewed Radiology: ordered.  Risk Prescription drug management.   The patient likely has some blepharitis which may or may not be related to getting the hair dye around the right eye, she does not appear to have a bacterial component conjunctivitis but was prescribed a topical antibiotic drop from her doctor today.  She was also given doxycycline  which I suspect is the cause of the rash which occurred shortly thereafter.  Visual acuity is normal, I cannot explain the slight tenderness on the back of the scalp although there does not appear to be any palpable lymphadenopathy and she has no trismus or torticollis, no meningismus.  She is requesting a CT scan of the orbits which is reasonable to help with her anxiety regarding this issue though I suspect this all goes back to having some blepharitis and should get better over time.  Vital signs normal, patient agreeable, advised against doxycycline given the rash that occurred shortly thereafter taking it.   Radiology Imaging: I personally viewed the images of the ordered radiographic studies and find periorbital cellulitis I agree with the radiologist interpretation as well  ED course: Patient appears very stable, normal vital signs, changed to clindamycin , recommended no doxycycline.  Patient agreeable and can follow-up, no signs of retro-orbital cellulitis or other acute findings      Final diagnoses:  Periorbital cellulitis of right eye    ED Discharge Orders          Ordered    clindamycin  (CLEOCIN ) 150 MG capsule  4 times daily        09/27/23 1900               Cleotilde Rogue, MD 09/27/23 1902

## 2023-09-27 NOTE — Discharge Instructions (Signed)
 Your CT scan was reassuring but did show that you likely have some infection around that eye, I would suggest that you completely stop the Bactrim  as you have, please do not take the doxycycline because it is given you a rash and I would like for you to start clindamycin , this is 1 capsule every 6 hours, and needs to be taken for 10 days.  You will need to have your family doctor recheck you within a couple of days.  I also want you to use the eyedrops that they gave you as prescribed.  ER for severe worsening symptoms

## 2024-03-07 ENCOUNTER — Other Ambulatory Visit: Payer: Self-pay | Admitting: Medical Genetics

## 2024-04-01 ENCOUNTER — Other Ambulatory Visit: Payer: Self-pay | Admitting: Medical Genetics

## 2024-04-01 DIAGNOSIS — Z006 Encounter for examination for normal comparison and control in clinical research program: Secondary | ICD-10-CM

## 2024-04-23 LAB — GENECONNECT MOLECULAR SCREEN: Genetic Analysis Overall Interpretation: NEGATIVE
